# Patient Record
Sex: Female | Born: 1935 | Race: White | Hispanic: No | Marital: Single | Smoking: Never smoker
Health system: Southern US, Community
[De-identification: ages and names within clinical notes are randomized; demographics above are authoritative.]

## PROBLEM LIST (undated history)

## (undated) DIAGNOSIS — S0232XA Fracture of orbital floor, left side, initial encounter for closed fracture: Secondary | ICD-10-CM

## (undated) DIAGNOSIS — I429 Cardiomyopathy, unspecified: Secondary | ICD-10-CM

## (undated) DIAGNOSIS — S060X9A Concussion with loss of consciousness of unspecified duration, initial encounter: Secondary | ICD-10-CM

## (undated) DIAGNOSIS — R296 Repeated falls: Secondary | ICD-10-CM

## (undated) DIAGNOSIS — I480 Paroxysmal atrial fibrillation: Secondary | ICD-10-CM

## (undated) DIAGNOSIS — E039 Hypothyroidism, unspecified: Secondary | ICD-10-CM

## (undated) DIAGNOSIS — S060XAA Concussion with loss of consciousness status unknown, initial encounter: Secondary | ICD-10-CM

## (undated) DIAGNOSIS — I447 Left bundle-branch block, unspecified: Secondary | ICD-10-CM

## (undated) DIAGNOSIS — I10 Essential (primary) hypertension: Secondary | ICD-10-CM

## (undated) DIAGNOSIS — I272 Pulmonary hypertension, unspecified: Secondary | ICD-10-CM

## (undated) HISTORY — DX: Hypothyroidism, unspecified: E03.9

## (undated) HISTORY — DX: Pulmonary hypertension, unspecified: I27.20

## (undated) HISTORY — DX: Essential (primary) hypertension: I10

## (undated) HISTORY — DX: Cardiomyopathy, unspecified: I42.9

## (undated) HISTORY — DX: Left bundle-branch block, unspecified: I44.7

---

## 2006-04-09 ENCOUNTER — Ambulatory Visit (HOSPITAL_COMMUNITY): Admission: RE | Admit: 2006-04-09 | Discharge: 2006-04-09 | Payer: Self-pay | Admitting: Ophthalmology

## 2006-06-17 ENCOUNTER — Emergency Department (HOSPITAL_COMMUNITY): Admission: EM | Admit: 2006-06-17 | Discharge: 2006-06-17 | Payer: Self-pay | Admitting: Emergency Medicine

## 2006-06-17 ENCOUNTER — Ambulatory Visit (HOSPITAL_COMMUNITY): Admission: RE | Admit: 2006-06-17 | Discharge: 2006-06-17 | Payer: Self-pay | Admitting: Ophthalmology

## 2006-07-15 ENCOUNTER — Ambulatory Visit: Payer: Self-pay | Admitting: Cardiology

## 2006-07-21 ENCOUNTER — Ambulatory Visit: Payer: Self-pay | Admitting: Cardiology

## 2006-07-25 ENCOUNTER — Ambulatory Visit: Payer: Self-pay | Admitting: Internal Medicine

## 2006-07-25 ENCOUNTER — Inpatient Hospital Stay (HOSPITAL_COMMUNITY): Admission: AD | Admit: 2006-07-25 | Discharge: 2006-07-28 | Payer: Self-pay | Admitting: Internal Medicine

## 2006-08-06 ENCOUNTER — Ambulatory Visit: Payer: Self-pay | Admitting: Cardiology

## 2006-08-07 ENCOUNTER — Ambulatory Visit: Payer: Self-pay | Admitting: Cardiology

## 2006-08-20 ENCOUNTER — Ambulatory Visit: Payer: Self-pay | Admitting: Cardiology

## 2006-08-21 ENCOUNTER — Ambulatory Visit: Payer: Self-pay | Admitting: Cardiology

## 2006-10-08 ENCOUNTER — Ambulatory Visit (HOSPITAL_COMMUNITY): Admission: RE | Admit: 2006-10-08 | Discharge: 2006-10-08 | Payer: Self-pay | Admitting: Ophthalmology

## 2006-10-21 ENCOUNTER — Ambulatory Visit: Payer: Self-pay | Admitting: Cardiology

## 2007-10-14 ENCOUNTER — Ambulatory Visit: Payer: Self-pay | Admitting: Cardiology

## 2008-11-02 DIAGNOSIS — I10 Essential (primary) hypertension: Secondary | ICD-10-CM

## 2008-11-02 DIAGNOSIS — I4891 Unspecified atrial fibrillation: Secondary | ICD-10-CM | POA: Insufficient documentation

## 2008-11-02 DIAGNOSIS — I472 Ventricular tachycardia: Secondary | ICD-10-CM

## 2008-11-02 DIAGNOSIS — E785 Hyperlipidemia, unspecified: Secondary | ICD-10-CM

## 2010-03-04 ENCOUNTER — Encounter: Payer: Self-pay | Admitting: Cardiology

## 2010-03-04 DIAGNOSIS — I509 Heart failure, unspecified: Secondary | ICD-10-CM

## 2010-03-04 DIAGNOSIS — I059 Rheumatic mitral valve disease, unspecified: Secondary | ICD-10-CM

## 2010-03-05 DIAGNOSIS — I5023 Acute on chronic systolic (congestive) heart failure: Secondary | ICD-10-CM

## 2010-03-07 DIAGNOSIS — R072 Precordial pain: Secondary | ICD-10-CM

## 2010-03-07 DIAGNOSIS — R9430 Abnormal result of cardiovascular function study, unspecified: Secondary | ICD-10-CM

## 2010-03-13 NOTE — Consult Note (Signed)
Summary: Consultation Report/ PROGRESS NOTE  Consultation Report/ PROGRESS NOTE   Imported By: Dorise Hiss 03/07/2010 17:00:25  _____________________________________________________________________  External Attachment:    Type:   Image     Comment:   External Document

## 2010-06-04 NOTE — Assessment & Plan Note (Signed)
[facility]                          [facility] NOTE   NAME:Anna House, [name]                       MRN:          [id number]  DATE:10/14/2007                            DOB:          November 13, 1933    PRIMARY CARE PHYSICIAN:  Colon Branch, MD   HISTORY OF PRESENT ILLNESS:  The patient is a 77 year old female with  mental retardation and history of paroxysmal tachycardia.  The patient  has previously had PAF with RVR, but also fascicular ventricular  tachycardia that was of reentrant type, originating from the right  ventricular outflow tract.  She was initially placed on verapamil,  although I do not see this on her med list anymore.  Interestingly,  however, she has currently no complaints and reports no palpitations and  states that she is feeling quite well.  She has had no presyncope or  syncope.   MEDICATIONS:  Levothyroxine, amlodipine, hydralazine, quinapril,  labetalol, Sinclair, hydrochlorothiazide, omeprazole, Klor-Con, Zetia,  Flonase, clonazepam, aspirin, and iron.   PHYSICAL EXAMINATION:  VITAL SIGNS:  Blood pressure 132/71, heart rate  63, and weight 128 pounds.  NECK:  Normal carotid upstrokes. No carotid bruits.  LUNGS:  Clear breath sounds bilaterally.  HEART:  Regular rate and rhythm.  Normal S1 and S2.  No murmurs, rubs,  or gallops.  ABDOMEN:  Soft and nontender.  No rebound or guarding.  No midline  pulsatile mass.  EXTREMITIES:  No cyanosis, clubbing, or edema.  NEUROLOGIC:  The patient is alert and oriented and grossly nonfocal.   PROBLEMS:  1. Paroxysmal atrial fibrillation with rapid ventricular rate, stable,      normal sinus rhythm by EKG today.  2. 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  3. Mental retardation.  4. Hypertension.  5. Normal left ventricular function.  6. Hypothyroidism.  7. Dyslipidemia.   PLAN:  1. The patient is doing  remarkably well.  She is supposed to be on      verapamil, but is currently not.  2. I did not make any changes as she seemed hemodynamically stable      without any current      arrhythmias and we will continue her on the beta-blocker that she      is on right now.  3. The patient can follow up with [place] in 1 year.     Learta Codding, MD,FACC  Electronically Signed    GED/MedQ  DD: 10/14/2007  DT: 10/15/2007  Job #: [id number]

## 2010-06-04 NOTE — Assessment & Plan Note (Signed)
[facility]                                 ON-CALL NOTE   NAME:Rathmann, AUBREYANNA DORROUGH                       MRN:          [id number]  DATE:07/25/2006                            DOB:          02/23/1933    DATES OF TELEPHONE CONVERSATIONS:  July 24, 2006 through July 25, 2006.   I initially received a telephone calls from the [employer] monitoring  service considering Ms. Wease, who is wearing a monitor.  Shanta at  [facility] called on July 4th, stating Ms. Taormina was having runs of V  tach, initially had a 12-beat run and then a 19-beat run.  I attempted  to reach Ms. Wilmott at [phone], with no answer.  I then tried another  phone number that was supplied by the [employer] services that was a  cellphone number and no answer and no recording.  I then called the home  number again and left a message on the answering machine.  However, I  did not receive a return call through the answering service.  I then  received 2 more pages through the answering service regarding the  nonsustained V tach.   I then received a page on July 25, 2006, through the answering service  from [employer], returned a call there and spoke with Angelyn Punt, stating Ms.  Brugh had a 34-beat run of V tach.  I finally was able to reach Ms.  Kamiya at the home number.  I spoke with her sister, who stated Ms. Dieter  had a hearing impairment and she would relay the information.  I  instructed her that it was critical that Ms. Tenny be evaluated.  Her  sister stated that Ms. Joo lived in [place], and that she would  take her to [facility].  I related her that I would like EMS to  transport the patient secondary to increased length of periods of  nonsustained V tach.  She stated she would call 911 to have the patient  transported to [facility] which would be the closest facility.  She stated that patient had not complained of any chest discomfort or  palpitations.  No presyncope or  syncopal episodes leading up to these  ventricular arrhythmias.      Dorian Pod, ACNP  Electronically Signed      Pricilla Riffle, MD, [facility]  Electronically Signed   MB/MedQ  DD: 07/25/2006  DT: 07/25/2006  Job #: [id number]

## 2010-06-04 NOTE — Consult Note (Signed)
NAMEDEVANY, AJA                ACCOUNT NO.:  [id number]   MEDICAL RECORD NO.:  [id number]          PATIENT TYPE:  INP   LOCATION:  2011                         FACILITY:  [facility]   PHYSICIAN:  Duke Salvia, MD, FACCDATE OF BIRTH:  Apr 18, 1933   DATE OF CONSULTATION:  07/26/2006  DATE OF DISCHARGE:                                 CONSULTATION   CARDIOLOGY CONSULTATION:  Thank you very much for asking [place] to see  Anna House in consultation because of recurrent tachy-arrhythmias.   Anna House is a 77 year old mentally retarded and quite hard-of-hearing  woman who fell on her stairs in March.  It was felt that she lost her  balance.  She had had no prior history of syncope and no prior history  of palpitations.  In the 6 weeks or so prior to this hospitalization,  however, she has developed tachy-palpitations with associated shortness  of breath and dizziness.  These were apparently abrupt in onset and  offset.  She was given a [employer] monitor which is described below.  She describes no limitations in her exercise tolerance, no shortness of  breath, chest pain or peripheral edema.   Her thromboembolic risk factors are notable for hypertension.   PAST MEDICAL HISTORY:  In addition to the above notable for:  1. Chronic obstructive pulmonary disease.  2. Treated hypothyroidism.  3. G-E reflux disease.  4. Iron deficiency anemia.  5. Cleft palate status post repair.   PAST SURGICAL HISTORY:  1. Laparoscopic cholecystectomy.  2. Esophageal dilatation.   MEDICATIONS:  Include:  1. Armour thyroid 30 grains.  2. Quinapril 40.  3. Antivert.  4. Omeprazole 10 b.i.d.  5. Singulair.  6. Hydrochlorothiazide 25 mg.  7. Zocor 40 mg.  8. Zetia 10 mg.  9. Fluticasone.  10.Toprol 50 mg.  11.Klonopin.   ALLERGIES:  PENICILLIN.   SOCIAL HISTORY:  She is not married.  She lives with her sisters.   FAMILY HISTORY:  Noncontributory.   REVIEW OF SYSTEMS:  Across multiple organ  systems, noncontributory.   PHYSICAL EXAMINATION:  GENERAL:  She is an elderly Caucasian female  appearing her stated age of 76.  VITAL SIGNS: Blood pressure 159/69 and  pulse 79.  HEENT:  Demonstrated to xanthomas.  Neck veins were flat.  Carotids were  brisk and full bilaterally without bruits.  BACK:  Without kyphosis, scoliosis.  LUNGS:  Clear.  HEART: Sounds regular without murmurs, gallops or rubs.  ABDOMEN: Soft, with active bowel sounds without midline pulsations or  hepatomegaly.  EXTREMITIES:  Femoral pulses not examined.  Distal pulses were intact.  There was no cyanosis, clubbing or edema.  NEUROLOGICAL: Exam notable for a very nasal speech, bilateral hearing  aids and being quite hard of hearing.  SKIN:  Warm and dry.   Electrocardiogram has not been obtained.  Multiple rhythm strips have  been reviewed.  The findings include:   1. Sinus tachycardia.  2. A narrow complex tachycardia that is regular.  3. Atrial fibrillation with a rapid ventricular response.  4. PACs that are associated with block and pauses.  5.  There is also PVCs that appear to be emerging from the right      ventricular outflow tract, and there is a separate wide complex      tachycardia that appears to be ventricular in origin.  The QRS is      relatively narrow at approximately 150 milliseconds or so.  It      starts with a PVC of identical morphology and terminates with      shortening of the R-R interval. This is most consistent with a      triggered or automatic tachy-arrhythmia.  Based on the      relationships of the P waves, I suspect that this is ventricular      tachycardia.  6. Hypokalemia.  7. Mental retardation.  8. Thromboembolic risk factors notable for hypertension.  9. History of falls.  10.Tendency toward bradycardia.   DISCUSSION:  Anna House has a multitude of arrhythmias that have  occurred relatively suddenly in the last 3 to 6 weeks.  This is either  because they are  occurring now for the first time or they are becoming  manifest now for the first time.  It is not clear.  Mechanistically a  myocarditis or inflammatory process might explain the acute onset of  these rhythms, that they are in fact occurring for the first time.  It  may be that there have been a variety of other things that have been in  the background, and then 1 of these new tachycardias is what has  provoked symptoms and brought her to attention.   The fall and the near-syncopal episode are both concerning. They could  either be related to the tachy or the bradycardia as noted.   I am concerned that the bradycardia will likely result in her needing  pacemaker implantation.   Approach to the tachycardias can be variable.  Given the multitude of  arrhythmias my inclination is to proceed with medical therapy initially.  The wide-complex tachycardia if ventricular in origin is likely a  fascicular tachycardia based on its narrowness, the triggered nature,  and the structurally normal heart.  Verapamil may be particularly useful  for this tachycardia.  It may also have an effect on the  supraventricular tachycardia in general and atrial fibrillation rate  control more generically.   To that end what I would recommend we do is:  1. Discontinue Toprol.  2. Try verapamil.  3. May need back-up brady pacing.  4. Replete potassium.  5. Check magnesium.   Thank you for the consultation.      Duke Salvia, MD, [facility]  Electronically Signed     SCK/MEDQ  D:  07/26/2006  T:  07/26/2006  Job:  [id number]   cc:   Learta Codding, MD,FACC

## 2010-06-04 NOTE — H&P (Signed)
NAMEMERRIDY, PASCOE                ACCOUNT NO.:  [id number]   MEDICAL RECORD NO.:  [id number]          PATIENT TYPE:  INP   LOCATION:  2011                         FACILITY:  [facility]   PHYSICIAN:  Pricilla Riffle, MD, FACCDATE OF BIRTH:  1933-08-16   DATE OF ADMISSION:  07/25/2006  DATE OF DISCHARGE:                              HISTORY & PHYSICAL   IDENTIFICATION:  Anna House is a 77 year old woman who was admitted for  evaluation of tachypalpitation.   HISTORY OF PRESENT ILLNESS:  The patient is followed by Lewayne Bunting in  [place].  Note she had had recent tachypalpitations with dizziness.  She  was placed on an event monitor.  On July 21, 2006, she had two episodes  of nonsustained ventricular tachycardia and told to come to the hospital  for further evaluation.  She was treated with Cardizem and a cardiology  consult was obtained.  She was admitted to the medicine service and  placed on telemetry.  Enzymes were negative.  Had an echocardiogram done  that showed an LVF of 60-65%.  She was placed on Toprol XL and Cardizem  was discontinued.  She was scheduled for an outpatient stress test on  July 28, 2006.   The patient has been home with the event monitor, and again, she set it  off today.  The company, [employer], called her for, again, tachycardia,  and told her to go to the hospital, where she was seen.  She had 43  beats of a wider complex tachycardia that was about 230 beats per  minute.  She also had bursts that were different of atrial fibrillation.  The patient had no syncope.  Denied chest pain.  Had some dizziness.  She was treated there in [facility] with IV Lopressor and  converted to sinus rhythm with first degree A-V block.   ALLERGIES:  PENICILLIN.   MEDICATIONS:  1. Armour Thyroid 30 grains.  2. Quinapril 40.  3. Antivert p.r.n.  4. Omeprazole 10 b.i.d.  5. Singulair 10.  6. Hydrochlorothiazide 25.  7. Zocor 40.  8. Zetia 10.  9. Fluticasone nasal spray.  10.Toprol XL 50.  11.Klonopin 0.5 daily.   PAST MEDICAL HISTORY:  1. Tachyarrhythmias.  2. Mental retardation.  3. Hypothyroidism.  4. Hypertension.  5. History of cleft palate.  6. History of a fall a couple of months ago (end of March, actually).      Patient fell going up the stairs and had significant hematoma at      her eye, around her neck, bruised her leg.  No syncope at the time.  7. Gastroesophageal reflux.  8. Dyslipidemia.  9. Irritable bowel syndrome.  10.Chronic obstructive pulmonary disease/asthma.  11.Osteoarthritis.  12.Status post cholecystectomy.   SOCIAL HISTORY:  The patient is cared for by one of her sisters.  She  has three other sisters.  No history of tobacco or alcohol use.   FAMILY HISTORY:  Mother had a history of stroke.  Father had a history  of heart disease.   REVIEW OF SYSTEMS:  All systems are reviewed.  Patient uses  a walker at  her own decision at home.  No recent fall.  Otherwise, all systems  reviewed negative to the above problem except as noted above.   PHYSICAL EXAMINATION:  GENERAL:  On exam, the patient is currently in no  acute distress.  VITAL SIGNS:  Blood pressure is 178/76, pulse is 57 and regular,  respiratory rate is 16.  O2 sat is 100% on 2 liters.  HEENT:  Normocephalic, atraumatic.  EOMI.  PERRL.  Patient wearing  hearing aids.  NECK:  JVP is normal.  No bruits.  No thyromegaly.  LUNGS:  Clear to auscultation without wheezes or rales.  CARDIAC EXAM:  Regular rate and rhythm, S1, S2.  No S3 or S4.  Grade 1-  2/6 systolic murmur just at the outflow tract.  ABDOMEN:  Supple, nontender.  No hepatosplenomegaly.  Normal bowel  sounds.  EXTREMITIES:  No lower extremity edema.  2+ pulses.  Patient moving all  extremities.  NEURO EXAM:  Grossly, the patient is alert and oriented x3.  Cranial  nerves II-XII appear grossly intact.  Moving all extremities, but did  not test motor exam.   LABS:  Labs include a hemoglobin of 14,  WBC of 6.6, BNP of 146.  TSH of  3, troponin 0.02, CK of 45 with an MB of 1.2.  Potassium was 3.2.  EKG  at 8:15 a.m. showed atrial fibrillation with ventricular rate of 118  beats per minute.   IMPRESSION:  1. The patient is a 77 year old woman with no known history of      coronary artery disease, now with palpitations.  Indeed, she has      documented atrial fibrillation on monitor.  She also has different      morphologic tachyarrhythmia at about 230 beats per minute, question      if it is an SVT with aberrancy versus VT.  She does have a normal      left ventricle.  She has not suffered any hemodynamic collapse with      this, again arguing against a ventricular origin.  I have discussed      with Dr. Graciela Husbands of EP.  Would plan to continue monitoring.  Continue      beta blockade.  I will cut back on quinapril, as we may need higher      dose of beta-blocker.  He will assess further testing when he sees      the patient.  2. Hypertension.  Again, follow.  Adjust as needed.  3. Dyslipidemia, on Zetia and Zocor.  4. Hypothyroidism, recent TSH normal.  5. History of fall, again, with atrial fibrillation.  I am very      reluctant to place her on Coumadin or heparin.  Her risk for      bleeding, in my opinion, seems to outweigh benefit.  I have      discussed this with the family.  Since she had such a bad fall this      spring, I consider her a higher fall risk.  Will continue to      follow.     Pricilla Riffle, MD, [facility]  Electronically Signed    PVR/MEDQ  D:  07/25/2006  T:  07/25/2006  Job:  [id number]

## 2010-06-04 NOTE — Assessment & Plan Note (Signed)
[facility]                          [facility] NOTE   NAME:Narayanan, KEANA DUEITT                       MRN:          [id number]  DATE:10/21/2006                            DOB:          [birth year]    REFERRING PHYSICIAN:  Virgina Organ   HISTORY OF PRESENT ILLNESS:  The patient is a 77 year old female with  mental retardation and paroxysmal atrial fibrillation. The patient also  has presumed supraventricular tachycardia which is non re-entrant type  with origination from right ventricular outflow tract. The patient has  been doing well on verapamil therapy. She actually has had no recurrent  episodes of palpitations and/or syncope. She denies any chest pain,  shortness of breath, orthopnea or PND. She is essentially asymptomatic  from a cardiovascular perspective.   CURRENT MEDICATIONS:  Are not entirely clear and the patient's sister  will call us back today. We do know that she is on verapamil 300 mg a  day and that Zocor has been discontinued. Other medications will be  listed as soon as we receive a phone call from her.   PHYSICAL EXAMINATION:  VITAL SIGNS: Blood pressure 138/71, heart rate 78  beats per minute. Weight is 129.  NECK: Normal carotid upstroke. No carotid bruits.  LUNGS:  Clear breath sounds bilaterally.  HEART: Regular rate and rhythm. Normal S1, S2. No murmur, rubs or  gallops.  ABDOMEN: Soft and nontender. No rebound or guarding. Good bowel sounds.  EXTREMITIES: No cyanosis, clubbing or edema.  NEURO: The patient is alert and oriented and grossly nonfocal.   PROBLEM LIST:  1. Paroxysmal atrial fibrillation with RVR.  2. Fascicular ventricular tachycardia non re-entrant.  3. Documented PVCs, RVOT origin.  4. Mental retardation.  5. Hypertension.  6. Normal left ventricular function.  7. Hypothyroidism.  8. Dyslipidemia.   PLAN:  1. The patient is doing remarkably well. She has responded well to her      medical  therapy with verapamil.  2. The patient will be seen back in six months, but we have made no      changes in her drug regimen. The sister will call us back to list      the medications.     Learta Codding, MD,FACC  Electronically Signed    GED/MedQ  DD: 10/21/2006  DT: 10/21/2006  Job #: [id number]   cc:   Dr. Virgina Organ

## 2010-06-04 NOTE — Discharge Summary (Signed)
Anna House, Anna House                ACCOUNT NO.:  [id number]   MEDICAL RECORD NO.:  [id number]          PATIENT TYPE:  INP   LOCATION:  2011                         FACILITY:  [facility]   PHYSICIAN:  Anna Riffle, MD, FACCDATE OF BIRTH:  Jun 09, 1933   DATE OF ADMISSION:  07/25/2006  DATE OF DISCHARGE:  07/28/2006                               DISCHARGE SUMMARY   Examination, dictation, and preparation of dictation greater than 45  minutes.   FINAL DIAGNOSES:  1. Admitted with tachy palpitation.  2. CardioNet monitor shows multiple arrhythmias.      a.     Paroxysmal atrial fibrillation/rapid ventricular rate.      b.     Fascicular ventricular tachycardia (non - reentrant).      c.     Premature ventricular contractions (right ventricular       outflow tract in origin).      d.     Pauses with blocked premature atrial contractions.  3. An a.m. tremor.  The patient and her family advised to take this up      with Anna House.  This does not show up as a cardiac problem on a      monitor, and they are self-limiting and sometimes involve the whole      body in shaking for a limited amount of time.   SECONDARY DIAGNOSES:  1. History of fall March 2008.  2. Hypertension.  3. Mental retardation.  4. Echocardiogram showing ejection fraction 60-65%.  5. Treated hypothyroidism.  6. Cleft palate.  7. Gastroesophageal reflux disease.  8. Dyslipidemia.  9. Irritable bowel syndrome pacer.   PLAN:  1. Patient discharging with continued CardioNet monitor.  2. Stress test which was planned for July 28, 2006 will be rescheduled      at the [facility] through [facility] office.  3. Follow up with Dr. Lewayne House August 21, 2006 at 3 o'clock.  4. Follow up with Anna House concerning tremors.   BRIEF HISTORY:  Anna House is a 77 year old female followed by Dr. Andee House  in the [facility] office [facility].   The patient has had recent tachy palpitation and dizziness.  She was  placed on a CardioNet monitor July 21, 2006.  The study showed two  episodes of nonsustained ventricular tachycardia.  She was told to come  to the hospital for further evaluation.  She was treated with Cardizem,  and a cardiology consult obtained.  She was admitted to the medicine  service and placed on telemetry.  Enzymes were negative.  Echocardiogram  showed left ventricular function at 60-65%.  She was placed on Toprol  XL, and Cardizem itself was discontinued.  Scheduled for outpatient  stress test July 28, 2006.   At home with the event monitor it again showed dysrhythmia.  Company  called her for a tachycardia.  Told her to go to the hospital.  The  study showed 43 beats of a wider complex tachycardia that was about 230  beats per minute.  She also had burst of atrial fibrillation.  The  patient is not having syncope or chest pain.  She had some dizziness.  She has been treated at [facility] with IV Lopressor.  She  converted to sinus rhythm with first degree AV block transferring July 5  to [facility].   HOSPITAL COURSE:  The patient transferring to [facility] from [facility] with tachy palpitations and several dysrhythmias on CardioNet  monitor.  She was started on Toprol here, and an electrophysiology  consult obtained.  Dr. Graciela House saw the patient, and with careful  examination of all the __________  electrocardiogram strips parsed out  of several dysrhythmias one was atrial fibrillation and another  fascicular ventricular tachycardia with narrow complex between 120 and  130 milliseconds.  From its axis orientation we considered it to be a  nonreentrant ventricular tachycardia backed up by preserved left  ventricular function.  We started the patient on verapamil 120 mg daily.  The patient has maintained sinus rhythm with this prescription, but is  hypertensive on July 7.  Dr. Graciela House increased the verapamil to 240 mg  daily with observation overnight.  The  patient had some nocturnal  bradycardia with heart rates in the mid 50s.  The verapamil was held at  240 mg at the time of discharge on July 8.  She had careful walk study  to examine for exercise incompetence.  Her heart rate did go into the  80s easily with ambulation in the halls here at [facility].  The patient is discharging July 8 on the following medications.   MEDICATIONS:  1. Verapamil 240 mg daily.  2. Quinapril 40 mg daily.  3. Zocor 20 mg daily at bedtime.  This is a new dose, and it has been      shown that verapamil and Zocor have interaction.  4. Zetia 10 mg daily.  5. Omeprazole 20 mg twice daily.  6. Armour Thyroid 30 mg daily.  7. Singulair 10 mg daily.  8. Klonopin 0.5 mg at bedtime, 1/4 tab at during the day as needed.  9. Fluticasone nasal spray 2 sprays daily.  10.Hydrochlorothiazide to continue for blood pressure.  11.Potassium chloride 20 mEq daily along with her hydrochlorothiazide.      The patient had a potassium of 3 on admission here.  12.The patient is to stop taking Diltiazem, Toprol, metoprolol.   The patient is continuing to wear the heart monitor to check for  bradycardia.   1. If the patient exhibits bradycardia on the CardioNet monitor, a      pacemaker will be warranted.  2. Stress test in the [facility].  The date and      time will be arranged through the [facility].  3. The patient will see Dr. Andee House Friday, August 1, at 3 o'clock.      The patient is also to continue taking her eye drops.   Basic metabolic panel July 6:  Sodium 143, potassium 3, chloride 97,  carbon dioxide 32, glucose 65, BUN 6, creatinine 0.73.  BMET on July 7:  Sodium 141, potassium 4.5, chloride 104, carbonate 31, glucose 86, BUN  8,  creatinine 0.68.  Magnesium this admission 1.9.   Dictating once again greater than 45 minutes.      Anna Mirza, PA      Anna Riffle, MD, [facility]  Electronically Signed    GM/MEDQ  D:   07/28/2006  T:  07/28/2006  Job:  [id number]   cc:  Learta Codding, MD,FACC  Liliane House, M.D.

## 2010-06-04 NOTE — Assessment & Plan Note (Signed)
[facility]                          [facility]ICE NOTE   NAME:Anna House, Anna House                       MRN:          [id number]  DATE:07/15/2006                            DOB:          1933/12/23    REASON FOR CONSULTATION:  Evaluation of palpitations and dizziness and  lightheadedness.   HISTORY OF PRESENT ILLNESS:  The patient is a 77 year old single female  with apparent mental retardation, cleft palate, and history of  hypertension.  The patient reports a prior history several months ago of  a fall.  According to her sister, the patient tripped on a flight of  stairs and had a fall.  She was seen in the emergency room.  There is no  reported history of loss of consciousness or syncope.  She has had  difficulty managing her blood pressure with fluctuating blood pressure  readings.  Apparently, she also wore a 24-hour blood pressure monitor.  Some blood pressure medication had to be stopped due to hypotension.  The last 4-6 weeks, however, the patient has now had palpitations which  occur intermittently and typically lasts half an hour to an hour and  then abruptly terminates.  This has been associated with shortness of  breath but no chest pain.  The patient does report associated  lightheadedness and dizziness without frank syncope.  The patient states  that she has no definite dyspnea, but she is not particularly active.  She also had a near syncopal episode 3 weeks ago and had a CT scan done  which demonstrated small vessel disease and possibly evidence for old  thalamic infarct.  According to the patient's sister, these episodes are  occurring on a daily basis.  In the office today her EKG, however, shows  normal sinus rhythm with nonspecific T-wave changes.   ALLERGIES:  No known drug allergies.   MEDICATIONS:  1. Quinapril 40 mg a day.  2. Simvastatin 40 mg daily.  3. Singulair 10 mg a day.  4. Zetia 10 mg a day.  5. Thyroid  hormone replacement.  6. Omeprazole.  7. Klor-Con.  8. Hydrochlorothiazide 25 mg p.o. daily.  9. Fluticasone b.i.d.  10.Iron 65 mg p.o. daily.  11.__________  p.o. nightly.   SOCIAL HISTORY:  The patient lives in [place], she does not smoke.   FAMILY HISTORY:  Noncontributory.  Mother had a stroke and father died  from heart disease at a later age.   REVIEW OF SYSTEMS:  As per HPI.  Denies any vomiting, no fever, chills,  __________ dysuria frequency.  Positive for palpitations, positive for  presyncope.  No myalgias or arthralgias.   PHYSICAL EXAMINATION:  VITAL SIGNS:  Blood pressure is 160/81, heart  rate is 91 beats per minute, weight is 125 pounds.  IN GENERAL:  A female in no apparent distress.  NECK EXAM:  Normal carotid upstrokes, no carotid bruit.  HEENT:  Normal.  LUNGS:  Clear breath sounds bilaterally with no wheezing.  HEART:  Regular rate and rhythm, normal S1, S2.  No murmurs, rubs, or  gallops.  ABDOMEN:  Soft.  EXTREMITY  EXAM:  No cyanosis, clubbing, or edema.   A 12-lead EKG, normal sinus rhythm, nonspecific T-wave changes.   PROBLEM LIST:  1. Presyncope and dizziness.  2. Palpitations.  3. Hypertension, difficult to control.  4. Thyroid abnormality.  5. Cleft palate.  6. History of fall.   PLAN:  1. I suspect the patient may have episodes of paroxysmal atrial      fibrillation.  She is at risk for this given the possibility of      hypertensive heart disease.  2. I do not detect any pathological murmurs.  I do not think the      patient has aortic stenosis.  We will proceed with an      echocardiogram study to evaluate left ventricular function and rule      out structural heart disease as well as valvular heart disease.  3. A Cardiolite study will be done to rule out ischemic heart disease,      but more importantly, she will have a CardioNet monitor to document      if she truly has atrial fibrillation.  In the interim, I did start      the patient  on medical therapy given her hypertension, and I have      opted for Cardizem in the event that the patient has rapid tachy      palpitations.  I started her Cardizem 20 mg a day.  4. The patient will follow up with [place] in the next couple of weeks to      review her study results.  I have told the patient's sister to give      [place] a call, and we will see her earlier if she has any recurrent      problems.     Learta Codding, MD,FACC  Electronically Signed    GED/MedQ  DD: 07/15/2006  DT: 07/15/2006  Job #: [id number]   cc:   [place]

## 2010-06-04 NOTE — Assessment & Plan Note (Signed)
[facility]                          [facility]ICE NOTE   NAME:House, Anna BALAN                       MRN:          [id number]  DATE:08/21/2006                            DOB:          [birth year]    PRIMARY CARDIOLOGIST:  Dr. Lewayne Bunting.   REASON FOR VISIT:  Post-hospital follow up.   Anna House is a 77 year old female, recently seen by [place] here at [facility] in consultation, and who presented with no prior history of  heart disease. Anna House had just been recently evaluated in our clinic, by  Dr. Andee Lineman, for complaint of palpitations with associated dizziness and  was referred for further workup with a CardioNet monitor and stress  Cardiolite. Anna House was found to have transient episodes of narrow complex  tachycardia with rates as fast as 200 bpm. Given these findings, Anna  House was instructed to proceed directly to Anna emergency room and was  subsequently referred to [place] for re-evaluation.   A 2D echocardiogram was done revealing normal left ventricular function.  Medications were adjusted with substitution of Diltiazem with Toprol XL  50 daily. Anna House was then referred for an outpatient stress test.   However, Anna House was once again instructed by Anna CardioNet  monitoring service to proceed directly back to Anna hospital, given  findings of recurrent tachycardia. According to Anna discharge summary  from [facility], Anna House had one study demonstrating 43 beats of WCT at  approximately 230 bpm. Anna House was also noted to have bursts of atrial  fibrillation.   Following return to [facility], Anna House was thus transferred  to [facility] for further evaluation where Anna House was seen by our  electrophysiology team with Dr. Sherryl Manges. Essentially, he concluded  that Anna House had evidence of atrial fibrillation as well as fascicular  ventricular tachycardia with narrow complex (between 120 and 130  milliseconds). He also suggested that  this was a non re-entrant  ventricular tachycardia which was backed up by preserved left  ventricular function.   Anna House was thus started on Verapamil 120 which was then increased  to 240. This was temporarily held, secondary to some nocturnal  bradycardia, but then resumed at Anna same dose following stabilization  of blood pressure and heart rate. Anna House was discharged from [facility] three days later, on July 8.   Anna House was instructed to continue on CardioNet monitoring, which  has since been completed, to rule out any evidence of bradycardia. In  that event, Anna House could be considered for pacemaker implantation.  CardioNet monitoring has been reviewed, however, and suggests occasional  rates as low as 50, with otherwise rates in Anna 60 to 90 range, as well  as intermittent atrial fibrillation/flutter.   Clinically, Anna House apparently has been doing much better since her  most recent hospitalization at [facility]. Anna House herself has  mental retardation and is not able to provide much of a history.  However, Anna House presents with her sister who corroborates her  clinical status and states that Anna House is feeling much better.  Specifically, there has been no recent evidence of dizziness or syncope.   CURRENT MEDICATIONS:  1. Quinapril 40 daily.  2. Singulair 10 daily.  3. Zetia 10 daily.  4. Thyroid medication as directed.  5. Omeprazole 20 b.i.d.  6. Hydrochlorothiazide 25 daily.  7. Fluticasone b.i.d.  8. Iron 65 daily.  9. Klonopin 0.5 nightly.  10.Verapamil 300 daily.  11.Zocor 20 nightly.  12.KCL 20 daily.   PHYSICAL EXAMINATION:  VITAL SIGNS:  Blood pressure 120/63, pulse 71 and  regular, weight 125.  GENERAL:  This is a 77 year old female sitting upright and in no  distress.  HEENT:  Normocephalic atraumatic.  NECK:  Palpable bilateral carotid pulses without bruits; no JVD at 90  degrees.  LUNGS:  Clear to auscultation in all  fields.  HEART:  Regular rate and rhythm (S1, S2), no significant murmurs. No  rubs.  ABDOMEN:  Benign.  EXTREMITIES:  No edema.  NEUROLOGIC:  No focal deficit.   IMPRESSION:  1. Recurrent tachy-arhythmia.      a.     Documented paroxysmal atrial fibrillation with RVR,      b.     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      c.     Documented PVCs (RVOT origin).  2. Mental retardation.  3. Hypertension.  4. Normal left ventricular function.  5. Hypothyroidism.  6. Dyslipidemia.   PLAN:  1. Given that Anna House is clinically stable, recommendation is to      continue Anna current medication regimen with respect to rate      control. Given that Anna House is not a suitable candidate for Coumadin      anticoagulation, as also noted by Dr. Sherryl Manges during his      recent evaluation, as well as Anna fact that Anna House has a CHAD2      score of 1 (hypertension), I have suggested that Anna House start aspirin      81 mg daily. Anna sister was agreeable with this plan. I have also      suggested that we stop Zocor given Anna addition of Verapamil and      possible adverse interaction. Anna House can thus just continue on      Zetia.  2. I recommended a follow up metabolic profile for assessment of      electrolytes and renal function.  3. Schedule return clinic follow up with myself and Dr. Andee Lineman in two      months.      Gene Serpe, PA-C  Electronically Signed      Learta Codding, MD,FACC  Electronically Signed   GS/MedQ  DD: 08/21/2006  DT: 08/22/2006  Job #: [id number]   cc:   Dorise Hiss, M.D.

## 2010-11-01 LAB — BASIC METABOLIC PANEL
Calcium: 9.4
GFR calc Af Amer: >60
GFR calc non Af Amer: >60
Glucose, Bld: 84
Sodium: 138

## 2010-11-01 LAB — HEMOGLOBIN AND HEMATOCRIT, BLOOD: Hemoglobin: 12.4

## 2010-11-05 LAB — BASIC METABOLIC PANEL
BUN: 8
CO2: 32
Calcium: 9.4
Calcium: 9.5
Creatinine, Ser: 0.73
GFR calc Af Amer: >60
GFR calc non Af Amer: >60
Glucose, Bld: 86
Sodium: 141

## 2010-11-07 ENCOUNTER — Encounter (INDEPENDENT_AMBULATORY_CARE_PROVIDER_SITE_OTHER): Payer: Self-pay | Admitting: *Deleted

## 2010-11-20 ENCOUNTER — Ambulatory Visit (INDEPENDENT_AMBULATORY_CARE_PROVIDER_SITE_OTHER): Payer: Medicare Other | Admitting: Internal Medicine

## 2010-12-02 ENCOUNTER — Encounter (INDEPENDENT_AMBULATORY_CARE_PROVIDER_SITE_OTHER): Payer: Self-pay | Admitting: *Deleted

## 2010-12-02 ENCOUNTER — Encounter (INDEPENDENT_AMBULATORY_CARE_PROVIDER_SITE_OTHER): Payer: Self-pay | Admitting: Internal Medicine

## 2010-12-02 ENCOUNTER — Ambulatory Visit (INDEPENDENT_AMBULATORY_CARE_PROVIDER_SITE_OTHER): Payer: Medicare Other | Admitting: Internal Medicine

## 2010-12-02 ENCOUNTER — Telehealth (INDEPENDENT_AMBULATORY_CARE_PROVIDER_SITE_OTHER): Payer: Self-pay | Admitting: *Deleted

## 2010-12-02 ENCOUNTER — Other Ambulatory Visit (INDEPENDENT_AMBULATORY_CARE_PROVIDER_SITE_OTHER): Payer: Self-pay | Admitting: *Deleted

## 2010-12-02 DIAGNOSIS — K5900 Constipation, unspecified: Secondary | ICD-10-CM

## 2010-12-02 DIAGNOSIS — Z1211 Encounter for screening for malignant neoplasm of colon: Secondary | ICD-10-CM

## 2010-12-02 DIAGNOSIS — K5641 Fecal impaction: Secondary | ICD-10-CM

## 2010-12-02 MED ORDER — PEG-KCL-NACL-NASULF-NA ASC-C 100 G PO SOLR: 1.0000 | Freq: Once | ORAL | Status: DC

## 2010-12-02 NOTE — Patient Instructions (Addendum)
Soap suds enema today x 2. Possible fecal impaction.  Miralax 1 scoop daily. If on an antidiarrheal please stop. PR  Tomorrow.

## 2010-12-02 NOTE — Telephone Encounter (Signed)
Patient needs movi prep 

## 2010-12-02 NOTE — Progress Notes (Addendum)
Subjective:     Patient ID: Anna House, female   DOB: 07/25/1933, 77 y.o.   MRN: [id number]  ( Sister is providing the history). Patient cannot hear.   HPI  Anna House is a 77 yr old female referred to our office for diarrhea. Her sister tells me that she had a large loose bowel movement about 6 weeks ago and it was black.  No Pepto Bismol.  She usually has diarrhea about once a week or every two weeks. No further melena. No bright red rectal bleeding.  She usually has one BM a day.  Appetite is good. No weight loss. No abdominal pain per sister. No dysphagia.  She has never undergone a screening colonoscopy  Medications are from Dr. Elizbeth Squires notes.  Patient did not have her medications with her. Review of Systems see hpi Current Outpatient Prescriptions  Medication Sig Dispense Refill  . atorvastatin (LIPITOR) 10 MG tablet Take 10 mg by mouth daily.        Marland Kitchen esomeprazole (NEXIUM) 40 MG capsule Take 40 mg by mouth daily before breakfast.        . furosemide (LASIX) 40 MG tablet Take 40 mg by mouth 2 (two) times daily.        . hydrOXYzine (ATARAX/VISTARIL) 25 MG tablet Take 25 mg by mouth 3 (three) times daily as needed.        Marland Kitchen levothyroxine (SYNTHROID, LEVOTHROID) 75 MCG tablet Take 75 mcg by mouth daily.        . potassium chloride SA (K-DUR,KLOR-CON) 20 MEQ tablet Take 20 mEq by mouth 2 (two) times daily.        . quinapril (ACCUPRIL) 40 MG tablet Take 40 mg by mouth at bedtime.         History reviewed. No pertinent past surgical history. Past Medical History  Diagnosis Date  . Hypertension   . CHF (congestive heart failure)   . Hypothyroid   . Anemia   . Cleft palate    History reviewed. No pertinent past surgical history. No Known Allergies History reviewed. No pertinent past surgical history. No Known Allergies History   Social History  . Marital Status: Single    Spouse Name: N/A    Number of Children: N/A  . Years of Education: N/A   Occupational History  . Not on  file.   Social History Main Topics  . Smoking status: Never Smoker   . Smokeless tobacco: Not on file  . Alcohol Use: No  . Drug Use: No  . Sexually Active: Not on file   Other Topics Concern  . Not on file   Social History Narrative  . No narrative on file  History reviewed. No pertinent family history. Family Status  Relation Status Death Age  . Mother Deceased     CVA  . Father Deceased     CAD  . Sister Alive     Pme deceased frp, CAD. two in good health  . Brother Deceased     One brother alive and one deceased from an infection and arthritis        Objective:   Physical Exam Filed Vitals:   12/02/10 1054  BP: 142/60  Pulse: 64  Temp: 97.6 F (36.4 C)  Height: 5' 2.5" (1.588 m)  Weight: 132 lb 6.4 oz (60.056 kg)  \  Alert and oriented. Skin warm and dry. Oral mucosa is moist. Dentures. Sclera anicteric, conjunctivae is pink. Thyroid not enlarged. No cervical lymphadenopathy. Lungs clear. Heart  regular rate and rhythm.  Abdomen is soft. Bowel sounds are positive. No hepatomegaly. No abdominal masses felt. No tenderness.Stool is guaiac negative. Stool is very hard in rectum.   No edema to lower extremities. Patient is alert and oriented.      Assessment:    Hx of diarrhea and one episode of melena.  In need of screening colonoscopy Constipation, possible impaction.    Plan:    Screening colonoscopy.    Go to drug store and get 2 enemas.  Give one enemaand if no results give the next enema.  Call with PR tomorrow.   She may need to go to the  ED for a disimpaction.   Miralax 1 scoop daily. If on a diarrhea medications, she needs to stop it.

## 2010-12-31 ENCOUNTER — Encounter (HOSPITAL_COMMUNITY): Payer: Self-pay | Admitting: Pharmacy Technician

## 2011-01-08 MED ORDER — SODIUM CHLORIDE 0.45 % IV SOLN
Freq: Once | INTRAVENOUS | Status: AC
  Administered 2011-01-09: 14:00:00 via INTRAVENOUS

## 2011-01-09 ENCOUNTER — Encounter (HOSPITAL_COMMUNITY)
Admission: RE | Disposition: A | Discharge status: Home / Self Care | Payer: Self-pay | Source: Ambulatory Visit | Attending: Internal Medicine

## 2011-01-09 ENCOUNTER — Encounter (HOSPITAL_COMMUNITY): Payer: Self-pay

## 2011-01-09 ENCOUNTER — Ambulatory Visit (HOSPITAL_COMMUNITY)
Admission: RE | Admit: 2011-01-09 | Discharge: 2011-01-09 | Disposition: A | Discharge status: Home / Self Care | Payer: Medicare Other | Source: Ambulatory Visit | Attending: Internal Medicine | Admitting: Internal Medicine

## 2011-01-09 DIAGNOSIS — R198 Other specified symptoms and signs involving the digestive system and abdomen: Secondary | ICD-10-CM | POA: Insufficient documentation

## 2011-01-09 DIAGNOSIS — R197 Diarrhea, unspecified: Secondary | ICD-10-CM

## 2011-01-09 DIAGNOSIS — I10 Essential (primary) hypertension: Secondary | ICD-10-CM | POA: Insufficient documentation

## 2011-01-09 DIAGNOSIS — K644 Residual hemorrhoidal skin tags: Secondary | ICD-10-CM | POA: Insufficient documentation

## 2011-01-09 DIAGNOSIS — Z1211 Encounter for screening for malignant neoplasm of colon: Secondary | ICD-10-CM

## 2011-01-09 DIAGNOSIS — Z79899 Other long term (current) drug therapy: Secondary | ICD-10-CM | POA: Insufficient documentation

## 2011-01-09 HISTORY — PX: COLONOSCOPY: SHX5424

## 2011-01-09 SURGERY — COLONOSCOPY
Anesthesia: Moderate Sedation

## 2011-01-09 MED ORDER — MIDAZOLAM HCL 5 MG/5ML IJ SOLN
INTRAMUSCULAR | Status: AC
  Filled 2011-01-09: qty 10

## 2011-01-09 MED ORDER — STERILE WATER FOR IRRIGATION IR SOLN
Status: DC | PRN
  Administered 2011-01-09: 16:00:00

## 2011-01-09 MED ORDER — MEPERIDINE HCL 50 MG/ML IJ SOLN
INTRAMUSCULAR | Status: DC | PRN
  Administered 2011-01-09: 25 mg via INTRAVENOUS

## 2011-01-09 MED ORDER — MIDAZOLAM HCL 5 MG/5ML IJ SOLN
INTRAMUSCULAR | Status: DC | PRN
  Administered 2011-01-09 (×2): 1 mg via INTRAVENOUS
  Administered 2011-01-09: 2 mg via INTRAVENOUS

## 2011-01-09 MED ORDER — MEPERIDINE HCL 50 MG/ML IJ SOLN
INTRAMUSCULAR | Status: AC
  Filled 2011-01-09: qty 1

## 2011-01-09 NOTE — H&P (Signed)
Anna House is an 77 y.o. female.   Chief Complaint: She is here for colonoscopy. HPI: Patient is 77 year old Caucasian female was referred for office last month for persistent diarrhea. She was noted to be constipated. She is undergoing colonoscopy primarily for diagnostic purposes. She denies abdominal pain rectal bleeding. There is no history of recent antibiotic use. She says she has a good appetite and has not lost any weight. She's never had colonoscopy before  Past Medical History  Diagnosis Date  . Hypertension   . CHF (congestive heart failure)   . Hypothyroid   . Anemia   . Cleft palate     History reviewed. No pertinent past surgical history.  History reviewed. No pertinent family history. Social History:  reports that she has never smoked. She does not have any smokeless tobacco history on file. She reports that she does not drink alcohol or use illicit drugs.  Allergies: No Known Allergies  Medications Prior to Admission  Medication Dose Route Frequency Provider Last Rate Last Dose  . 0.45 % sodium chloride infusion   Intravenous Once Malissa Hippo, MD 20 mL/hr at 01/09/11 1338    . meperidine (DEMEROL) 50 MG/ML injection           . midazolam (VERSED) 5 MG/5ML injection            No current outpatient prescriptions on file as of 01/09/2011.    No results found for this or any previous visit (from the past 48 hour(s)). No results found.  Review of Systems  Constitutional: Negative for weight loss.  Gastrointestinal: Negative for nausea, vomiting, abdominal pain, blood in stool and melena.    Blood pressure 176/83, pulse 80, temperature 97.9 F (36.6 C), temperature source Oral, resp. rate 16, height 5' (1.524 m), SpO2 97.00%. Physical Exam  Constitutional: She appears well-developed and well-nourished.  HENT:  Mouth/Throat: Oropharynx is clear and moist.  Eyes: Conjunctivae are normal. No scleral icterus.  Neck: No thyromegaly present.  Cardiovascular:  Normal rate, regular rhythm and normal heart sounds.   No murmur heard. Respiratory: Effort normal and breath sounds normal.  GI: Soft. She exhibits no distension and no mass. There is no tenderness.  Musculoskeletal: She exhibits no edema.  Lymphadenopathy:    She has no cervical adenopathy.  Neurological: She is alert.  Skin: Skin is warm and dry.     Assessment/Plan History of diarrhea. Diagnostic colonoscopy  REHMAN,NAJEEB U 01/09/2011, 3:42 PM

## 2011-01-09 NOTE — Op Note (Signed)
COLONOSCOPY PROCEDURE REPORT  PATIENT:  Anna House  MR#:  [id number] Birthdate:  December 18, 1933, 77 y.o., female Endoscopist:  Dr. Malissa Hippo, MD Referred By:  Dr. Colon Branch, M.D. Procedure Date: 01/09/2011  Procedure:   Colonoscopy  Indications:  Patient is 77 year old Caucasian female who has noted change in her bowel habits. She initially developed diarrhea and by the time she was seen office she was constipated.  She is undergoing colonoscopy for diagnostic purposes. Informed consent; procedure and risks were reviewed with the patient and informed consent was obtained.  Medications:  Demerol 25 mg IV Versed 4 mg IV  Description of procedure:  After a digital rectal exam was performed, that colonoscope was advanced from the anus through the rectum and colon to the area of the cecum, ileocecal valve and appendiceal orifice. The cecum was deeply intubated. These structures were well-seen and photographed for the record. From the level of the cecum and ileocecal valve, the scope was slowly and cautiously withdrawn. The mucosal surfaces were carefully surveyed utilizing scope tip to flexion to facilitate fold flattening as needed. The scope was pulled down into the rectum where a thorough exam including retroflexion was performed.  Findings:   Prep excellent. Normal terminal ileum. Normal mucosa of the rectum and colon . Hemorrhoids noted below the dentate line.  Therapeutic/Diagnostic Maneuvers Performed:  None  Complications:  None  Cecal Withdrawal Time:  8 minutes  Impression:  Normal terminal ileum. Normal colonoscopy except external hemorrhoids.  Recommendations:  High fiber diet. Anna House will call office should she develop diarrhea again.  Anna House  01/09/2011 4:14 PM  CC: Dr. Colon Branch, MD & Dr. Bonnetta Barry ref. provider found

## 2011-01-23 ENCOUNTER — Encounter (HOSPITAL_COMMUNITY): Payer: Self-pay | Admitting: Internal Medicine

## 2011-08-29 ENCOUNTER — Encounter: Payer: Self-pay | Admitting: Cardiology

## 2011-08-29 DIAGNOSIS — I10 Essential (primary) hypertension: Secondary | ICD-10-CM

## 2011-08-31 ENCOUNTER — Other Ambulatory Visit: Payer: Self-pay

## 2011-08-31 DIAGNOSIS — I10 Essential (primary) hypertension: Secondary | ICD-10-CM

## 2011-08-31 DIAGNOSIS — I472 Ventricular tachycardia, unspecified: Secondary | ICD-10-CM

## 2011-09-01 DIAGNOSIS — I4891 Unspecified atrial fibrillation: Secondary | ICD-10-CM

## 2011-09-09 ENCOUNTER — Ambulatory Visit (INDEPENDENT_AMBULATORY_CARE_PROVIDER_SITE_OTHER): Payer: Medicare Other | Admitting: Cardiology

## 2011-09-09 ENCOUNTER — Encounter: Payer: Self-pay | Admitting: Cardiology

## 2011-09-09 VITALS — BP 166/77 | HR 69 | Ht 62.0 in | Wt 128.0 lb

## 2011-09-09 DIAGNOSIS — I519 Heart disease, unspecified: Secondary | ICD-10-CM

## 2011-09-09 DIAGNOSIS — I447 Left bundle-branch block, unspecified: Secondary | ICD-10-CM

## 2011-09-09 DIAGNOSIS — I509 Heart failure, unspecified: Secondary | ICD-10-CM

## 2011-09-09 DIAGNOSIS — I4891 Unspecified atrial fibrillation: Secondary | ICD-10-CM

## 2011-09-09 MED ORDER — AMLODIPINE BESYLATE 5 MG PO TABS: 5.0000 mg | ORAL_TABLET | Freq: Every day | ORAL | Status: AC

## 2011-09-09 NOTE — Patient Instructions (Signed)
   Increase Amlodipine to 5mg  daily (may take 2 of the 2.5mg  tabs till finish current supply) Your physician wants you to follow up in: 6 months.  You will receive a reminder letter in the mail one-two months in advance.  If you don't receive a letter, please call our office to schedule the follow up appointment

## 2011-09-28 DIAGNOSIS — I5042 Chronic combined systolic (congestive) and diastolic (congestive) heart failure: Secondary | ICD-10-CM | POA: Insufficient documentation

## 2011-09-28 DIAGNOSIS — I447 Left bundle-branch block, unspecified: Secondary | ICD-10-CM | POA: Insufficient documentation

## 2011-09-28 NOTE — Assessment & Plan Note (Signed)
Ejection fraction 2025% patient's mother candidate for ICD.

## 2011-09-28 NOTE — Assessment & Plan Note (Signed)
Chronic and stable.   

## 2011-09-28 NOTE — Assessment & Plan Note (Addendum)
The recent hospitalization patient was found to have a fibrillation with aberrancy.  No clear evidence of ventricular tachycardia not a candidate for Coumadin.on amiodarone.

## 2011-09-28 NOTE — Progress Notes (Signed)
Anna Bottoms, MD, [facility] Board Certified in Adult Cardiovascular Medicine,Internal Medicine and Critical Care Medicine    CC:    followup after recent hospitalization for atrial fibrillation                                                                              HPI:        Patient was recently hospitalized for a fibrillation with aberrancy and LV dysfunction.  He felt she was not a candidate for Coumadin or ICD.  She was started on amiodarone as well as spironolactone during this clinic visit and she was never started on spironolactone.  Hemostasis of confusional hospital discharge regarding her medications.  The patient was very this hospital admission but this has now resolved and she feels much better.  She has chronic left bundle branch block and an ejection fraction of 20-25%.  She is managed medically.  PMH: reviewed and listed in Problem List in Electronic Records (and see below) Past Medical History  Diagnosis Date  . Hypertension   . CHF (congestive heart failure)   . Hypothyroid   . Anemia   . Cleft palate    Past Surgical History  Procedure Date  . Colonoscopy 01/09/2011    Procedure: COLONOSCOPY;  Surgeon: Malissa Hippo, MD;  [facility]: [facility];  Service: Endoscopy;  Laterality: N/A;  2:00 pm    Allergies/SH/FHX : available in Electronic Records for review  No Known Allergies History   Social History  . Marital Status: Single    Spouse Name: N/A    Number of Children: N/A  . Years of Education: N/A   Occupational History  . Not on file.   Social History Main Topics  . Smoking status: Never Smoker   . Smokeless tobacco: Never Used  . Alcohol Use: No  . Drug Use: No  . Sexually Active: Not on file   Other Topics Concern  . Not on file   Social History Narrative  . No narrative on file   No family history on file.  Medications: Current Outpatient Prescriptions  Medication Sig Dispense Refill  . amiodarone (PACERONE) 200 MG tablet  Take 1 tablet by mouth Daily.      Marland Kitchen amLODipine (NORVASC) 5 MG tablet Take 1 tablet (5 mg total) by mouth daily.  30 tablet  6  . carvedilol (COREG) 3.125 MG tablet Take 1 tablet by mouth Twice daily.      Marland Kitchen esomeprazole (NEXIUM) 40 MG capsule Take 40 mg by mouth 2 (two) times daily.       . furosemide (LASIX) 40 MG tablet Take 40 mg by mouth 2 (two) times daily.        . hydrocortisone (ANUSOL-HC) 2.5 % rectal cream Place 1 application rectally 2 (two) times daily as needed.        . hydrOXYzine (ATARAX/VISTARIL) 25 MG tablet Take 25 mg by mouth at bedtime.       . hydrOXYzine (VISTARIL) 25 MG capsule Take 25 mg by mouth at bedtime.      Marland Kitchen levothyroxine (SYNTHROID, LEVOTHROID) 75 MCG tablet Take 75 mcg by mouth daily.        Marland Kitchen loteprednol (  LOTEMAX) 0.5 % ophthalmic suspension Place 1 drop into both eyes 2 (two) times daily.        . montelukast (SINGULAIR) 10 MG tablet Take 10 mg by mouth daily.        . pantoprazole (PROTONIX) 40 MG tablet Take 40 mg by mouth daily.      . potassium chloride SA (K-DUR,KLOR-CON) 20 MEQ tablet Take 20 mEq by mouth 2 (two) times daily.        . quinapril (ACCUPRIL) 20 MG tablet Take 1 tablet by mouth Daily.      . Vitamin D, Ergocalciferol, (DRISDOL) 50000 UNITS CAPS Take 50,000 Units by mouth every 7 (seven) days.        ROS: No nausea or vomiting. No fever or chills.No melena or hematochezia.No bleeding.No claudication  Physical Exam: BP 166/77  Pulse 69  Ht 5\' 2"  (1.575 m)  Wt 128 lb (58.06 kg)  BMI 23.41 kg/m2 General:well-nourished white female in no distress Neck:normal carotid upstroke without carotid bruits.  No thyromegaly or thyroid Lungs:clear breath sounds bilaterally Cardiac:regular rate and rhythm with normal S1 and S2 Vascular:no edema Skin:normal distal pulses skin warm and dry Physcologic:normal affect  12lead WUJ:WJXBJY sinus rhythm with left bundle branch block Limited bedside ECHO:N/A No images are attached to the  encounter.   I reviewed and summarized the old records. I reviewed ECG and prior blood work.  Assessment and Plan  ATRIAL FIBRILLATION The recent hospitalization patient was found to have a fibrillation with aberrancy.  No clear evidence of ventricular tachycardia not a candidate for Coumadin.on amiodarone.  Left bundle branch block Chronic and stable.  LV dysfunction Ejection fraction 2025% patient's mother candidate for ICD.    Patient Active Problem List  Diagnosis  . HYPERLIPIDEMIA-MIXED  . HYPERTENSION, UNSPECIFIED  . VENTRICULAR TACHYCARDIA  . ATRIAL FIBRILLATION  . Left bundle branch block  . LV dysfunction

## 2011-12-19 ENCOUNTER — Emergency Department (HOSPITAL_COMMUNITY): Payer: Medicare Other

## 2011-12-19 ENCOUNTER — Emergency Department (HOSPITAL_COMMUNITY)
Admission: EM | Admit: 2011-12-19 | Discharge: 2011-12-19 | Disposition: A | Discharge status: Home / Self Care | Payer: Medicare Other | Attending: Emergency Medicine | Admitting: Emergency Medicine

## 2011-12-19 ENCOUNTER — Encounter (HOSPITAL_COMMUNITY): Payer: Self-pay | Admitting: Emergency Medicine

## 2011-12-19 DIAGNOSIS — M542 Cervicalgia: Secondary | ICD-10-CM | POA: Insufficient documentation

## 2011-12-19 DIAGNOSIS — S0993XA Unspecified injury of face, initial encounter: Secondary | ICD-10-CM | POA: Insufficient documentation

## 2011-12-19 DIAGNOSIS — I509 Heart failure, unspecified: Secondary | ICD-10-CM | POA: Insufficient documentation

## 2011-12-19 DIAGNOSIS — Z862 Personal history of diseases of the blood and blood-forming organs and certain disorders involving the immune mechanism: Secondary | ICD-10-CM | POA: Insufficient documentation

## 2011-12-19 DIAGNOSIS — Y9289 Other specified places as the place of occurrence of the external cause: Secondary | ICD-10-CM | POA: Insufficient documentation

## 2011-12-19 DIAGNOSIS — Y9301 Activity, walking, marching and hiking: Secondary | ICD-10-CM | POA: Insufficient documentation

## 2011-12-19 DIAGNOSIS — M549 Dorsalgia, unspecified: Secondary | ICD-10-CM | POA: Insufficient documentation

## 2011-12-19 DIAGNOSIS — R296 Repeated falls: Secondary | ICD-10-CM | POA: Insufficient documentation

## 2011-12-19 DIAGNOSIS — I10 Essential (primary) hypertension: Secondary | ICD-10-CM | POA: Insufficient documentation

## 2011-12-19 DIAGNOSIS — M12859 Other specific arthropathies, not elsewhere classified, unspecified hip: Secondary | ICD-10-CM | POA: Insufficient documentation

## 2011-12-19 DIAGNOSIS — W19XXXA Unspecified fall, initial encounter: Secondary | ICD-10-CM

## 2011-12-19 DIAGNOSIS — Z23 Encounter for immunization: Secondary | ICD-10-CM | POA: Insufficient documentation

## 2011-12-19 DIAGNOSIS — E079 Disorder of thyroid, unspecified: Secondary | ICD-10-CM | POA: Insufficient documentation

## 2011-12-19 MED ORDER — TETANUS-DIPHTH-ACELL PERTUSSIS 5-2.5-18.5 LF-MCG/0.5 IM SUSP
0.5000 mL | Freq: Once | INTRAMUSCULAR | Status: AC
  Administered 2011-12-19: 0.5 mL via INTRAMUSCULAR
  Filled 2011-12-19: qty 0.5

## 2011-12-19 NOTE — ED Notes (Signed)
Per EMS pt had an un witnessed fall in her front yard and was found face down by family. Pt c/o of neck and back pain and pain above the left knee, and left elbow. Pt is hard to understand so EMS was unable to obtain a history or more details about pain and exact location. Vitals 145/68, 59 P CBG 114. 22g RAC. Pt was in flutter on ekg strip.

## 2011-12-19 NOTE — ED Notes (Signed)
Patient transported to CT 

## 2011-12-19 NOTE — ED Notes (Addendum)
MD at bedside. 

## 2011-12-19 NOTE — ED Provider Notes (Addendum)
History     CSN: [id number]  Arrival date & time 12/19/11  1231   First MD Initiated Contact with Patient 12/19/11 1248      Chief Complaint  Patient presents with  . Fall    (Consider location/radiation/quality/duration/timing/severity/associated sxs/prior treatment) HPI Comments: Patient presents after sustaining a fall on her front car. She states that she was walking in her yard with her walker and she stubbed her toe and will downhill over her walker. She denies any syncope. She complains of pain to her neck and back and her left side. She denies any recent illnesses, dizziness, chest pain or shortness of breath that preceded the fall. She complains of constant throbbing pain to her neck back and her left side. She is not on any anticoagulants.  Patient is a 78 y.o. female presenting with fall.  Fall Pertinent negatives include no fever, no numbness, no abdominal pain, no nausea, no vomiting, no hematuria and no headaches.    Past Medical History  Diagnosis Date  . Hypertension   . CHF (congestive heart failure)   . Hypothyroid   . Anemia   . Cleft palate     Past Surgical History  Procedure Date  . Colonoscopy 01/09/2011    Procedure: COLONOSCOPY;  Surgeon: Malissa Hippo, MD;  Location: [facility];  Service: Endoscopy;  Laterality: N/A;  2:00 pm    History reviewed. No pertinent family history.  History  Substance Use Topics  . Smoking status: Never Smoker   . Smokeless tobacco: Never Used  . Alcohol Use: No    OB History    Grav Para Term Preterm Abortions TAB SAB Ect Mult Living                  Review of Systems  Constitutional: Negative for fever, chills, diaphoresis and fatigue.  HENT: Positive for neck pain. Negative for congestion, rhinorrhea and sneezing.   Eyes: Negative.   Respiratory: Negative for cough, chest tightness and shortness of breath.   Cardiovascular: Negative for chest pain and leg swelling.  Gastrointestinal: Negative for  nausea, vomiting, abdominal pain, diarrhea and blood in stool.  Genitourinary: Negative for frequency, hematuria, flank pain and difficulty urinating.  Musculoskeletal: Positive for back pain and arthralgias.  Skin: Negative for rash.  Neurological: Negative for dizziness, speech difficulty, weakness, numbness and headaches.    Allergies  Review of patient's allergies indicates no known allergies.  Home Medications   Current Outpatient Rx  Name  Route  Sig  Dispense  Refill  . AMIODARONE HCL 200 MG PO TABS   Oral   Take 1 tablet by mouth Daily.         Marland Kitchen AMLODIPINE BESYLATE 5 MG PO TABS   Oral   Take 1 tablet (5 mg total) by mouth daily.   30 tablet   6     Dose increased 09/09/2011.   Marland Kitchen CARVEDILOL 3.125 MG PO TABS   Oral   Take 1 tablet by mouth Twice daily.         Marland Kitchen ESOMEPRAZOLE MAGNESIUM 40 MG PO CPDR   Oral   Take 40 mg by mouth 2 (two) times daily.          . FUROSEMIDE 40 MG PO TABS   Oral   Take 40 mg by mouth 2 (two) times daily.           Marland Kitchen HYDROCORTISONE 2.5 % RE CREA   Rectal   Place 1 application rectally 2 (  two) times daily as needed.           Marland Kitchen HYDROXYZINE HCL 25 MG PO TABS   Oral   Take 25 mg by mouth at bedtime.          Marland Kitchen HYDROXYZINE PAMOATE 25 MG PO CAPS   Oral   Take 25 mg by mouth at bedtime.         Marland Kitchen LEVOTHYROXINE SODIUM 75 MCG PO TABS   Oral   Take 75 mcg by mouth daily.           Marland Kitchen LOTEPREDNOL ETABONATE 0.5 % OP SUSP   Both Eyes   Place 1 drop into both eyes 2 (two) times daily.           Marland Kitchen MONTELUKAST SODIUM 10 MG PO TABS   Oral   Take 10 mg by mouth daily.           Marland Kitchen PANTOPRAZOLE SODIUM 40 MG PO TBEC   Oral   Take 40 mg by mouth daily.         Marland Kitchen POTASSIUM CHLORIDE CRYS ER 20 MEQ PO TBCR   Oral   Take 20 mEq by mouth 2 (two) times daily.           . QUINAPRIL HCL 20 MG PO TABS   Oral   Take 1 tablet by mouth Daily.         Marland Kitchen VITAMIN D (ERGOCALCIFEROL) 50000 UNITS PO CAPS   Oral   Take  50,000 Units by mouth every 7 (seven) days.           BP 175/102  Pulse 55  Temp 97.9 F (36.6 C) (Oral)  Resp 22  SpO2 100%  Physical Exam  Constitutional: She is oriented to person, place, and time. She appears well-developed and well-nourished.  HENT:  Head: Normocephalic and atraumatic.       Small abrasion to the right cheek.  Eyes: Pupils are equal, round, and reactive to light.  Neck: Normal range of motion. Neck supple.       Patient has mild tenderness along the mid and lower cervical spine. She also has mild tenderness throughout the thoracic and lumbosacral spine. No step-offs or deformities are noted.  Cardiovascular: Normal rate, regular rhythm and normal heart sounds.   Pulmonary/Chest: Effort normal and breath sounds normal. No respiratory distress. She has no wheezes. She has no rales. She exhibits no tenderness.  Abdominal: Soft. Bowel sounds are normal. There is no tenderness. There is no rebound and no guarding.  Musculoskeletal: Normal range of motion. She exhibits no edema.       Positive pain on range of motion of the left shoulder. She has mild tenderness to the left knee and left hip. No deformity or swelling is noted. She has no pain on range of motion of the right arm and right leg. Neurovascular intact.  Lymphadenopathy:    She has no cervical adenopathy.  Neurological: She is alert and oriented to person, place, and time.  Skin: Skin is warm and dry. No rash noted.  Psychiatric: She has a normal mood and affect.    ED Course  Procedures (including critical care time)   Date: 12/19/2011  Rate: 92  Rhythm: indeterminate, unclear if this is sinus with artifact or a-flutter, will repeat EKG  QRS Axis: left  Intervals: normal  ST/T Wave abnormalities: nonspecific ST/T changes  Conduction Disutrbances:left bundle branch block  Narrative Interpretation:   Old EKG Reviewed: changes noted,  1. Fall       MDM  Pt well appearing with  mechanical fall.  No preceeding symptoms.  Pt awaiting imaging studies.  Will move to CDU.  If x-rays negative and pt can ambulate, will plan on discharge.        Rolan Bucco, MD 12/19/11 1348  Rolan Bucco, MD 12/19/11 [phone]

## 2011-12-19 NOTE — ED Provider Notes (Signed)
Medical screening examination/treatment/procedure(s) were performed by non-physician practitioner and as supervising physician I was immediately available for consultation/collaboration.   Rolan Bucco, MD 12/19/11 [phone]

## 2011-12-19 NOTE — ED Provider Notes (Signed)
3:20 - x-rays resulted and reviewed. Negative for acute bony injury or bleed. Collar removed. Patient to be ambulated to make sure she is at baseline weight bearing ability and steady. Anticipate discharge.   Rodena Medin, PA-C 12/19/11 1525

## 2012-04-03 ENCOUNTER — Emergency Department (HOSPITAL_COMMUNITY): Payer: Medicare Other

## 2012-04-03 ENCOUNTER — Emergency Department (HOSPITAL_COMMUNITY)
Admission: EM | Admit: 2012-04-03 | Discharge: 2012-04-03 | Disposition: A | Discharge status: Home / Self Care | Payer: Medicare Other | Attending: Emergency Medicine | Admitting: Emergency Medicine

## 2012-04-03 ENCOUNTER — Encounter (HOSPITAL_COMMUNITY): Payer: Self-pay | Admitting: Emergency Medicine

## 2012-04-03 DIAGNOSIS — S0993XA Unspecified injury of face, initial encounter: Secondary | ICD-10-CM | POA: Insufficient documentation

## 2012-04-03 DIAGNOSIS — Y9301 Activity, walking, marching and hiking: Secondary | ICD-10-CM | POA: Insufficient documentation

## 2012-04-03 DIAGNOSIS — IMO0002 Reserved for concepts with insufficient information to code with codable children: Secondary | ICD-10-CM | POA: Insufficient documentation

## 2012-04-03 DIAGNOSIS — Y9289 Other specified places as the place of occurrence of the external cause: Secondary | ICD-10-CM | POA: Insufficient documentation

## 2012-04-03 DIAGNOSIS — S0990XA Unspecified injury of head, initial encounter: Secondary | ICD-10-CM | POA: Insufficient documentation

## 2012-04-03 DIAGNOSIS — E039 Hypothyroidism, unspecified: Secondary | ICD-10-CM | POA: Insufficient documentation

## 2012-04-03 DIAGNOSIS — R296 Repeated falls: Secondary | ICD-10-CM | POA: Insufficient documentation

## 2012-04-03 DIAGNOSIS — Z862 Personal history of diseases of the blood and blood-forming organs and certain disorders involving the immune mechanism: Secondary | ICD-10-CM | POA: Insufficient documentation

## 2012-04-03 DIAGNOSIS — I10 Essential (primary) hypertension: Secondary | ICD-10-CM | POA: Insufficient documentation

## 2012-04-03 DIAGNOSIS — Z79899 Other long term (current) drug therapy: Secondary | ICD-10-CM | POA: Insufficient documentation

## 2012-04-03 DIAGNOSIS — I509 Heart failure, unspecified: Secondary | ICD-10-CM | POA: Insufficient documentation

## 2012-04-03 DIAGNOSIS — Q359 Cleft palate, unspecified: Secondary | ICD-10-CM | POA: Insufficient documentation

## 2012-04-03 NOTE — ED Notes (Signed)
Moves all extremities as much as possible while secured on LSB.  Moaned with pain when right leg lifted/moved although was observed to move right leg by herself without moaning.  Abrasions to both knees.  Patient states she has wet her clothing.  Will obtain assistance to remove from LSB and remove clothing

## 2012-04-03 NOTE — ED Provider Notes (Signed)
History     CSN: [id number]  Arrival date & time 04/03/12  Anna House   First MD Initiated Contact with Patient 04/03/12 2023      Chief Complaint  Patient presents with  . Fall    fell while using walker, moves all extremeties, abrasions to both knees, immobilized on LSB with C-Collar in place.    (Consider location/radiation/quality/duration/timing/severity/associated sxs/prior treatment) HPI  The patient presents with multiple family members to provide the history of present illness.  The patient had a witnessed fall just prior to arrival.  She uses a walker.  She was observed to fall face forward over the walker as it caught the edge of the carpet.  The patient is awake and alert, but has verbal difficulty, and with c-collar in place cannot provide significant details of the history of present illness.  Per report the patient had no loss of consciousness, no subsequent vomiting, no incontinence.  The patient herself confirms that she has minimal pain.  The pain seems to be in the head and neck region.  The patient denies confusion, disorientation, unilateral weakness.  Past Medical History  Diagnosis Date  . Hypertension   . CHF (congestive heart failure)   . Hypothyroid   . Anemia   . Cleft palate     Past Surgical History  Procedure Laterality Date  . Colonoscopy  01/09/2011    Procedure: COLONOSCOPY;  Surgeon: Malissa Hippo, MD;  Location: [facility];  Service: Endoscopy;  Laterality: N/A;  2:00 pm    No family history on file.  History  Substance Use Topics  . Smoking status: Never Smoker   . Smokeless tobacco: Never Used  . Alcohol Use: No    OB History   Grav Para Term Preterm Abortions TAB SAB Ect Mult Living                  Review of Systems  All other systems reviewed and are negative.    Allergies  Review of patient's allergies indicates no known allergies.  Home Medications   Current Outpatient Rx  Name  Route  Sig  Dispense  Refill  .  amiodarone (PACERONE) 200 MG tablet   Oral   Take 1 tablet by mouth Daily.         Marland Kitchen amLODipine (NORVASC) 5 MG tablet   Oral   Take 1 tablet (5 mg total) by mouth daily.   30 tablet   6     Dose increased 09/09/2011.   . clonazePAM (KLONOPIN) 0.5 MG tablet   Oral   Take 0.5 mg by mouth 4 (four) times daily.         . furosemide (LASIX) 40 MG tablet   Oral   Take 40 mg by mouth 2 (two) times daily.           Marland Kitchen LORazepam (ATIVAN) 1 MG tablet   Oral   Take 1 mg by mouth 2 (two) times daily.         . carvedilol (COREG) 3.125 MG tablet   Oral   Take 1 tablet by mouth Twice daily.         . hydrOXYzine (ATARAX/VISTARIL) 25 MG tablet   Oral   Take 25 mg by mouth at bedtime.          Marland Kitchen levothyroxine (SYNTHROID, LEVOTHROID) 75 MCG tablet   Oral   Take 75 mcg by mouth daily.           Marland Kitchen loteprednol (  LOTEMAX) 0.5 % ophthalmic suspension   Both Eyes   Place 1 drop into both eyes 2 (two) times daily.           . montelukast (SINGULAIR) 10 MG tablet   Oral   Take 10 mg by mouth daily.           . pantoprazole (PROTONIX) 40 MG tablet   Oral   Take 40 mg by mouth daily.         . potassium chloride SA (K-DUR,KLOR-CON) 20 MEQ tablet   Oral   Take 20 mEq by mouth 2 (two) times daily.           . quinapril (ACCUPRIL) 20 MG tablet   Oral   Take 1 tablet by mouth Daily.           BP 180/54  Pulse 58  SpO2 95%  Physical Exam  Nursing note and vitals reviewed. Constitutional: She is oriented to person, place, and time. She appears well-developed and well-nourished. No distress. Cervical collar and backboard in place.  HENT:  Head: Normocephalic and atraumatic.  Patient's cleft palate, is edentulous  Eyes: Conjunctivae and EOM are normal.  Neck: No tracheal deviation present.  Cardiovascular: Normal rate and regular rhythm.   Pulmonary/Chest: Effort normal and breath sounds normal. No stridor. No respiratory distress.  Abdominal: She exhibits no  distension.  Musculoskeletal: She exhibits no edema.  Pelvis is stable, though with mild tenderness to palpation.  Both knees have superficial abrasions, but appear old.  She moves her hips, knees, ankles spontaneously, appropriately.  Both shoulders, elbows, wrists are within normal limits.  Neurological: She is alert and oriented to person, place, and time. No cranial nerve deficit.  Skin: Skin is warm and dry.  Psychiatric: She has a normal mood and affect.    ED Course  Procedures (including critical care time)  Labs Reviewed - No data to display Dg Pelvis 1-2 Views  04/03/2012  *RADIOLOGY REPORT*  Clinical Data: Right greater than left hip pain after fall today.  PELVIS - 1-2 VIEW  Comparison: Pelvis and left hip 12/19/2011  Findings: Degenerative changes in both hips, greater on the right. There is near complete loss of acetabular joint space on the right with sclerosis and subcortical lucencies present on both sides of the joint.  Less prominent joint space narrowing and sclerosis on the left with osteophytes on the femoral side.  The pelvic rim, SI joints, and symphysis pubis are not displaced.  No acute fracture or subluxation is appreciated.  IMPRESSION: Degenerative changes in the hips, greatest on the right.  No acute fractures identified.   Original Report Authenticated By: Burman Nieves, M.D.      No diagnosis found.  o2-99%ra, nml  MDM  This elderly female with chronic use of a walker presents after a mechanical fall that was witnessed.  However, the patient cannot describe entirety of the fall, and given concern for head trauma, neck pain, she had radiographic studies of these areas, as well as her pelvis.  With reassuring results, the patient was discharged in stable condition to follow up with her primary care physician, and orthopedics as needed.    Gerhard Munch, MD 04/03/12 2201

## 2012-04-03 NOTE — ED Notes (Signed)
Now moves all extremeties without complaint

## 2012-04-03 NOTE — ED Notes (Addendum)
Removed from LSB per protocol.  No noted tenderness to back / posterior surface.  C-collar remains in place. Warm blanket applied

## 2012-04-03 NOTE — ED Notes (Signed)
Hx obtained from EMS  Family reports patient going up steps, did not lift her walker high enough, fell over the walker.  Arrived on LSB with C-collar, full immobilization.

## 2012-05-13 ENCOUNTER — Emergency Department (HOSPITAL_COMMUNITY)
Admission: EM | Admit: 2012-05-13 | Discharge: 2012-05-13 | Disposition: A | Discharge status: Home / Self Care | Payer: Medicare Other | Attending: Emergency Medicine | Admitting: Emergency Medicine

## 2012-05-13 ENCOUNTER — Emergency Department (HOSPITAL_COMMUNITY): Payer: Medicare Other

## 2012-05-13 ENCOUNTER — Encounter (HOSPITAL_COMMUNITY): Payer: Self-pay | Admitting: Emergency Medicine

## 2012-05-13 DIAGNOSIS — E039 Hypothyroidism, unspecified: Secondary | ICD-10-CM | POA: Insufficient documentation

## 2012-05-13 DIAGNOSIS — Y9289 Other specified places as the place of occurrence of the external cause: Secondary | ICD-10-CM | POA: Insufficient documentation

## 2012-05-13 DIAGNOSIS — W19XXXA Unspecified fall, initial encounter: Secondary | ICD-10-CM

## 2012-05-13 DIAGNOSIS — W010XXA Fall on same level from slipping, tripping and stumbling without subsequent striking against object, initial encounter: Secondary | ICD-10-CM | POA: Insufficient documentation

## 2012-05-13 DIAGNOSIS — Y9389 Activity, other specified: Secondary | ICD-10-CM | POA: Insufficient documentation

## 2012-05-13 DIAGNOSIS — I509 Heart failure, unspecified: Secondary | ICD-10-CM | POA: Insufficient documentation

## 2012-05-13 DIAGNOSIS — I10 Essential (primary) hypertension: Secondary | ICD-10-CM | POA: Insufficient documentation

## 2012-05-13 DIAGNOSIS — Z79899 Other long term (current) drug therapy: Secondary | ICD-10-CM | POA: Insufficient documentation

## 2012-05-13 DIAGNOSIS — Z862 Personal history of diseases of the blood and blood-forming organs and certain disorders involving the immune mechanism: Secondary | ICD-10-CM | POA: Insufficient documentation

## 2012-05-13 DIAGNOSIS — Z87798 Personal history of other (corrected) congenital malformations: Secondary | ICD-10-CM | POA: Insufficient documentation

## 2012-05-13 DIAGNOSIS — S0993XA Unspecified injury of face, initial encounter: Secondary | ICD-10-CM | POA: Insufficient documentation

## 2012-05-13 NOTE — ED Provider Notes (Signed)
History    This chart was scribed for Anna Hutching, MD by Quintella Reichert, ED scribe.  This patient was seen in room APA11/APA11 and the patient's care was started at 10:07 AM.   CSN: [id number]  Arrival date & time 05/13/12  0272      Chief Complaint  Patient presents with  . Fall   Level 5 Caveat: Mild Dementia  The history is provided by the patient and a relative. No language interpreter was used.    Anna House is a 79 y.o. female with h/o frequent falls brought to the Emergency Department due to a fall this morning.  Pt states she tripped and fell in the bedroom.  She denies subsequent pain in any area.  Pt's sister reports her behavior is unchanged following the fall.  Past Medical History  Diagnosis Date  . Hypertension   . CHF (congestive heart failure)   . Hypothyroid   . Anemia   . Cleft palate     Past Surgical History  Procedure Laterality Date  . Colonoscopy  01/09/2011    Procedure: COLONOSCOPY;  Surgeon: Malissa Hippo, MD;  Location: [facility];  Service: Endoscopy;  Laterality: N/A;  2:00 pm    History reviewed. No pertinent family history.  History  Substance Use Topics  . Smoking status: Never Smoker   . Smokeless tobacco: Never Used  . Alcohol Use: No    OB History   Grav Para Term Preterm Abortions TAB SAB Ect Mult Living                  Review of Systems  Unable to perform ROS: Dementia   A complete 10 system review of systems was obtained and all systems are negative except as noted in the HPI and PMH.    Allergies  Review of patient's allergies indicates no known allergies.  Home Medications   Current Outpatient Rx  Name  Route  Sig  Dispense  Refill  . amiodarone (PACERONE) 200 MG tablet   Oral   Take 1 tablet by mouth Daily.         Marland Kitchen amLODipine (NORVASC) 5 MG tablet   Oral   Take 1 tablet (5 mg total) by mouth daily.   30 tablet   6     Dose increased 09/09/2011.   . carvedilol (COREG) 3.125 MG tablet    Oral   Take 1 tablet by mouth Twice daily.         . clonazePAM (KLONOPIN) 0.5 MG tablet   Oral   Take 0.5 mg by mouth 4 (four) times daily.         . furosemide (LASIX) 40 MG tablet   Oral   Take 40 mg by mouth 2 (two) times daily.           . hydrOXYzine (ATARAX/VISTARIL) 25 MG tablet   Oral   Take 25 mg by mouth at bedtime.          Marland Kitchen levothyroxine (SYNTHROID, LEVOTHROID) 75 MCG tablet   Oral   Take 75 mcg by mouth daily.           Marland Kitchen LORazepam (ATIVAN) 1 MG tablet   Oral   Take 1 mg by mouth 2 (two) times daily.         Marland Kitchen loteprednol (LOTEMAX) 0.5 % ophthalmic suspension   Both Eyes   Place 1 drop into both eyes 2 (two) times daily.           Marland Kitchen  montelukast (SINGULAIR) 10 MG tablet   Oral   Take 10 mg by mouth daily.           . pantoprazole (PROTONIX) 40 MG tablet   Oral   Take 40 mg by mouth daily.         . potassium chloride SA (K-DUR,KLOR-CON) 20 MEQ tablet   Oral   Take 20 mEq by mouth 2 (two) times daily.           . quinapril (ACCUPRIL) 20 MG tablet   Oral   Take 1 tablet by mouth Daily.           BP 133/50  Pulse 66  Temp(Src) 98.2 F (36.8 C) (Oral)  Resp 16  Ht 5\' 2"  (1.575 m)  Wt 130 lb (58.968 kg)  BMI 23.77 kg/m2  SpO2 97%  Physical Exam  Nursing note and vitals reviewed. Constitutional: She is oriented to person, place, and time. She appears well-developed and well-nourished.  HENT:  Head: Normocephalic and atraumatic.  Eyes: Conjunctivae and EOM are normal. Pupils are equal, round, and reactive to light.  Neck: Normal range of motion. Neck supple.  Cardiovascular: Normal rate, regular rhythm and normal heart sounds.   Pulmonary/Chest: Effort normal and breath sounds normal.  Abdominal: Soft. Bowel sounds are normal.  Musculoskeletal: Normal range of motion.  Neurological: She is alert and oriented to person, place, and time.  Skin: Skin is warm and dry.  Psychiatric: She has a normal mood and affect.      ED Course  Procedures (including critical care time)  DIAGNOSTIC STUDIES: Oxygen Saturation is 97% on room air, normal by my interpretation.    COORDINATION OF CARE: 10:10 AM-Explained that pt does not appear to be injured and that imaging is most likely unnecessary.  Discussed treatment plan which includes walker use to prevent further falls with pt and sister at bedside and they agreed to plan.       Labs Reviewed - No data to display Dg Hip Bilateral W/pelvis  05/13/2012  *RADIOLOGY REPORT*  Clinical Data: Bilateral hip pain multiple recent falls.  BILATERAL HIP WITH PELVIS - 4+ VIEW  Comparison: 04/03/2012.  Findings: AP view of the pelvis and AP and lateral views of the hips bilaterally demonstrate no definite acute displaced fracture, subluxation, dislocation or joint abnormality.  There is advanced loss of joint space, subchondral sclerosis, subchondral cyst formation and osteophyte formation in the hip joints bilaterally (right much greater than left), compatible with advanced osteoarthritis.  IMPRESSION: 1.  No acute radiographic abnormality of the bony pelvis or the hips bilaterally. 2.  Advanced bilateral hip joint osteoarthritis (right greater than left), as above.   Original Report Authenticated By: Trudie Reed, M.D.      No diagnosis found.    MDM  Accidental mechanical fall. Sister reports normal behavior.  Plain films of pelvis negative. No head or neck trauma.  No neuro deficits.      I personally performed the services described in this documentation, which was scribed in my presence. The recorded information has been reviewed and is accurate.    Anna Hutching, MD 05/13/12 1110

## 2012-05-13 NOTE — ED Notes (Signed)
Pt has slight r leg shortening noted. Pt c/o pain to both hips but states L one always hurts, r hurts worse today. Bruising noted to L knee

## 2012-05-13 NOTE — ED Notes (Signed)
Pt lives at home with sisters. Fell pta. EMS states at least 3 falls this month and each time has had big shoes on. Today now has large socks on. Pt denies LOC. Pt c/o lower back/r hip pain/r leg pain. Pt arrived alert/oriented to most.

## 2012-05-13 NOTE — ED Notes (Signed)
Assisted with voiding by nt-jessica.

## 2012-05-13 NOTE — ED Notes (Signed)
Pt arrived by ems from home, has fallen 3 times in the last few weeks. Did not want to come to ed today, encouraged to come for eval by ems. Pt having right hip pain today. Denies any other injury.

## 2012-06-24 ENCOUNTER — Emergency Department (HOSPITAL_COMMUNITY): Payer: Medicare Other

## 2012-06-24 ENCOUNTER — Encounter (HOSPITAL_COMMUNITY): Payer: Self-pay | Admitting: Emergency Medicine

## 2012-06-24 ENCOUNTER — Emergency Department (HOSPITAL_COMMUNITY)
Admission: EM | Admit: 2012-06-24 | Discharge: 2012-06-24 | Disposition: A | Discharge status: Home / Self Care | Payer: Medicare Other | Attending: Emergency Medicine | Admitting: Emergency Medicine

## 2012-06-24 DIAGNOSIS — S20229A Contusion of unspecified back wall of thorax, initial encounter: Secondary | ICD-10-CM | POA: Insufficient documentation

## 2012-06-24 DIAGNOSIS — Y92009 Unspecified place in unspecified non-institutional (private) residence as the place of occurrence of the external cause: Secondary | ICD-10-CM | POA: Insufficient documentation

## 2012-06-24 DIAGNOSIS — I509 Heart failure, unspecified: Secondary | ICD-10-CM | POA: Insufficient documentation

## 2012-06-24 DIAGNOSIS — E039 Hypothyroidism, unspecified: Secondary | ICD-10-CM | POA: Insufficient documentation

## 2012-06-24 DIAGNOSIS — I10 Essential (primary) hypertension: Secondary | ICD-10-CM | POA: Insufficient documentation

## 2012-06-24 DIAGNOSIS — M542 Cervicalgia: Secondary | ICD-10-CM | POA: Insufficient documentation

## 2012-06-24 DIAGNOSIS — Z8719 Personal history of other diseases of the digestive system: Secondary | ICD-10-CM | POA: Insufficient documentation

## 2012-06-24 DIAGNOSIS — Y9301 Activity, walking, marching and hiking: Secondary | ICD-10-CM | POA: Insufficient documentation

## 2012-06-24 DIAGNOSIS — Z862 Personal history of diseases of the blood and blood-forming organs and certain disorders involving the immune mechanism: Secondary | ICD-10-CM | POA: Insufficient documentation

## 2012-06-24 DIAGNOSIS — Z880 Allergy status to penicillin: Secondary | ICD-10-CM | POA: Insufficient documentation

## 2012-06-24 DIAGNOSIS — R296 Repeated falls: Secondary | ICD-10-CM | POA: Insufficient documentation

## 2012-06-24 DIAGNOSIS — Z79899 Other long term (current) drug therapy: Secondary | ICD-10-CM | POA: Insufficient documentation

## 2012-06-24 LAB — BASIC METABOLIC PANEL
BUN: 14 mg/dL (ref 6–23)
Chloride: 98 mEq/L (ref 96–112)
GFR calc Af Amer: 53 mL/min — ABNORMAL LOW (ref >90)
GFR calc non Af Amer: 46 mL/min — ABNORMAL LOW (ref >90)
Potassium: 3.2 mEq/L — ABNORMAL LOW (ref 3.5–5.1)
Sodium: 142 mEq/L (ref 135–145)

## 2012-06-24 LAB — URINALYSIS, ROUTINE W REFLEX MICROSCOPIC
Nitrite: NEGATIVE
Specific Gravity, Urine: >1.03 — ABNORMAL HIGH (ref 1.005–1.030)
Urobilinogen, UA: 0.2 mg/dL (ref 0.0–1.0)
pH: 5.5 (ref 5.0–8.0)

## 2012-06-24 LAB — CBC WITH DIFFERENTIAL/PLATELET
Basophils Relative: 1 % (ref 0–1)
Eosinophils Absolute: 0.1 10*3/uL (ref 0.0–0.7)
Hemoglobin: 12 g/dL (ref 12.0–15.0)
MCHC: 32.9 g/dL (ref 30.0–36.0)
Monocytes Relative: 9 % (ref 3–12)
Neutro Abs: 4.1 10*3/uL (ref 1.7–7.7)
Neutrophils Relative %: 72 % (ref 43–77)
Platelets: 302 10*3/uL (ref 150–400)
RBC: 3.86 MIL/uL — ABNORMAL LOW (ref 3.87–5.11)

## 2012-06-24 LAB — URINE MICROSCOPIC-ADD ON

## 2012-06-24 MED ORDER — TRAMADOL-ACETAMINOPHEN 37.5-325 MG PO TABS: 1.0000 | ORAL_TABLET | Freq: Four times a day (QID) | ORAL | Status: DC | PRN

## 2012-06-24 MED ORDER — TRAMADOL HCL 50 MG PO TABS
50.0000 mg | ORAL_TABLET | Freq: Once | ORAL | Status: AC
  Administered 2012-06-24: 50 mg via ORAL
  Filled 2012-06-24: qty 1

## 2012-06-24 NOTE — ED Provider Notes (Signed)
History  This chart was scribed for Flint Melter, MD by Bennett Scrape, ED Scribe. This patient was seen in room APA18/APA18 and the patient's care was started at 2:34 PM.  CSN: [id number]  Arrival date & time 06/24/12  1424   First MD Initiated Contact with Patient 06/24/12 1434      Chief Complaint  Patient presents with  . Fall  . Back Pain   Level 5 Caveat-confusion   The history is provided by the patient and the EMS personnel. No language interpreter was used.    HPI Comments: Anna House is a 79 y.o. female brought in by ambulance fully immobilized, who presents to the Emergency Department complaining of a fall. Pt states that she usually walks with a walker but was not using one today and fell. She is c/o back pain currently.    Past Medical History  Diagnosis Date  . Hypertension   . CHF (congestive heart failure)   . Hypothyroid   . Anemia   . Cleft palate     Past Surgical History  Procedure Laterality Date  . Colonoscopy  01/09/2011    Procedure: COLONOSCOPY;  Surgeon: Malissa Hippo, MD;  Location: [facility];  Service: Endoscopy;  Laterality: N/A;  2:00 pm    History reviewed. No pertinent family history.  History  Substance Use Topics  . Smoking status: Never Smoker   . Smokeless tobacco: Never Used  . Alcohol Use: No    No OB history provided.  Review of Systems  Unable to perform ROS: Other    Allergies  Penicillins  Home Medications   Current Outpatient Rx  Name  Route  Sig  Dispense  Refill  . amiodarone (PACERONE) 200 MG tablet   Oral   Take 1 tablet by mouth Daily.         Marland Kitchen amLODipine (NORVASC) 5 MG tablet   Oral   Take 1 tablet (5 mg total) by mouth daily.   30 tablet   6     Dose increased 09/09/2011.   . carvedilol (COREG) 3.125 MG tablet   Oral   Take 1 tablet by mouth Twice daily.         . clonazePAM (KLONOPIN) 0.5 MG tablet   Oral   Take 0.5 mg by mouth 4 (four) times daily.         . furosemide  (LASIX) 40 MG tablet   Oral   Take 40 mg by mouth 2 (two) times daily.           . hydrOXYzine (ATARAX/VISTARIL) 25 MG tablet   Oral   Take 25 mg by mouth at bedtime.          Marland Kitchen labetalol (NORMODYNE) 200 MG tablet   Oral   Take 600 mg by mouth 2 (two) times daily.         Marland Kitchen levothyroxine (SYNTHROID, LEVOTHROID) 100 MCG tablet   Oral   Take 100 mcg by mouth daily before breakfast.         . montelukast (SINGULAIR) 10 MG tablet   Oral   Take 10 mg by mouth daily.           . quinapril (ACCUPRIL) 20 MG tablet   Oral   Take 1 tablet by mouth Daily.         . Vitamin D, Ergocalciferol, (DRISDOL) 50000 UNITS CAPS   Oral   Take 50,000 Units by mouth every 7 (seven) days.         Marland Kitchen  traMADol-acetaminophen (ULTRACET) 37.5-325 MG per tablet   Oral   Take 1 tablet by mouth every 6 (six) hours as needed for pain.   30 tablet   0     Triage Vitals: BP 152/54  Pulse 58  Temp(Src) 98.3 F (36.8 C) (Oral)  Resp 20  SpO2 94%  Physical Exam  Nursing note and vitals reviewed. Constitutional: She appears well-developed and well-nourished. Cervical collar in place.  HENT:  Head: Normocephalic and atraumatic.  Neck: Phonation normal.  Tenderness to the c-spine  Musculoskeletal: Normal range of motion.  T-spine and L-spine are non-tender, no step offs  Neurological: She has normal strength.    ED Course  Procedures (including critical care time) Medications  traMADol (ULTRAM) tablet 50 mg (50 mg Oral Given 06/24/12 1712)    Patient Vitals for the past 24 hrs:  BP Temp Temp src Pulse Resp SpO2  06/24/12 1713 122/48 mmHg - - 74 18 98 %  06/24/12 1443 152/54 mmHg 98.3 F (36.8 C) Oral 58 20 94 %     DIAGNOSTIC STUDIES: Oxygen Saturation is 94% on room air, adequate by my interpretation.    COORDINATION OF CARE: 2:36 PM-Discussed treatment plan which includes CT of head and c-spine, xray of t-spine and l-spine, CBC panel, BMP and UA with pt at bedside and pt  agreed to plan.    5:20 PM Reevaluation with update and discussion. After initial assessment and treatment, an updated evaluation reveals Sisters in room. Ambulating per her usual. Kiasia Chou L    Labs Reviewed  CBC WITH DIFFERENTIAL - Abnormal; Notable for the following:    RBC 3.86 (*)    All other components within normal limits  BASIC METABOLIC PANEL - Abnormal; Notable for the following:    Potassium 3.2 (*)    CO2 38 (*)    Glucose, Bld 115 (*)    Creatinine, Ser 1.14 (*)    GFR calc non Af Amer 46 (*)    GFR calc Af Amer 53 (*)    All other components within normal limits  URINALYSIS, ROUTINE W REFLEX MICROSCOPIC - Abnormal; Notable for the following:    Specific Gravity, Urine >1.030 (*)    Bilirubin Urine SMALL (*)    Ketones, ur TRACE (*)    Protein, ur 100 (*)    Leukocytes, UA TRACE (*)    All other components within normal limits  URINE MICROSCOPIC-ADD ON - Abnormal; Notable for the following:    Bacteria, UA MANY (*)    All other components within normal limits  URINE CULTURE   Dg Thoracic Spine W/swimmers  06/24/2012   *RADIOLOGY REPORT*  Clinical Data: Fall.  Back pain.  Compression fracture.  THORACIC SPINE - 2 VIEW + SWIMMERS  Comparison: 12/19/2011 radiographs.  Findings: Thoracic spinal alignment is unchanged.  No compression fractures are identified.  Multilevel thoracic spondylosis. Cholecystectomy clips are present in the right upper quadrant. Mild levoconvex thoracolumbar curvature with the apex at T12-L1.  IMPRESSION: No acute abnormality or interval change.   Original Report Authenticated By: Andreas Newport, M.D.   Dg Lumbar Spine Complete  06/24/2012   *RADIOLOGY REPORT*  Clinical Data: Fall.  Back pain.  LUMBAR SPINE - COMPLETE 4+ VIEW  Comparison: Lumbar spine radiographs 12/19/2011.  Findings: Mild levoconvex curvature appears similar to the prior exam.  Phleboliths noted. Cholecystectomy clips are present in the right upper quadrant.  Large stool  burden in the abdomen.  No lumbar spine compression fracture is present.  Multilevel spondylosis,  most pronounced at L5- S1.  The visualized lower thoracic spine appears within normal limits.  Lumbosacral junction normal.  IMPRESSION: Lumbar spondylosis without interval change or acute abnormality.   Original Report Authenticated By: Andreas Newport, M.D.   Ct Head Wo Contrast  06/24/2012   *RADIOLOGY REPORT*  Clinical Data:  78 year old female status post fall.  Immobilized.  CT HEAD WITHOUT CONTRAST CT CERVICAL SPINE WITHOUT CONTRAST  Technique:  Multidetector CT imaging of the head and cervical spine was performed following the standard protocol without intravenous contrast.  Multiplanar CT image reconstructions of the cervical spine were also generated.  Comparison:  04/03/2012 and earlier.  CT HEAD  Findings: Stable paranasal sinuses and mastoids (previous mastoidectomies.  No acute orbit soft tissue findings.  No scalp hematoma identified.  Calvarium intact.  Stable cerebral volume. Stable ventricle size and configuration. No midline shift, mass effect, or evidence of mass lesion.  Stable cerebral white matter hypodensity.  Chronic left thalamic lacunar infarct. No evidence of cortically based acute infarction identified.  No acute intracranial hemorrhage identified.  IMPRESSION: 1. No acute intracranial abnormality.  No acute traumatic injury identified. 2.  Cervical spine findings below.  CT CERVICAL SPINE  Findings: Stable cervical vertebral height and alignment with reversed upper cervical lordosis.  Chronic C1-C2 degenerative changes. Visualized skull base is intact.  No atlanto-occipital dissociation.  Bilateral posterior element alignment is within normal limits.  Cervicothoracic junction alignment is within normal limits.  Intermittent mild motion artifact.  No acute cervical spine fracture identified.  Negative lung apices.  Visualized upper thoracic levels appear grossly intact.  Incidental  retropharyngeal course of both carotid arteries. Visualized paraspinal soft tissues are within normal limits.  IMPRESSION: No acute fracture or listhesis identified in the cervical spine. Ligamentous injury is not excluded.   Original Report Authenticated By: Erskine Speed, M.D.   Ct Cervical Spine Wo Contrast  06/24/2012   *RADIOLOGY REPORT*  Clinical Data:  79 year old female status post fall.  Immobilized.  CT HEAD WITHOUT CONTRAST CT CERVICAL SPINE WITHOUT CONTRAST  Technique:  Multidetector CT imaging of the head and cervical spine was performed following the standard protocol without intravenous contrast.  Multiplanar CT image reconstructions of the cervical spine were also generated.  Comparison:  04/03/2012 and earlier.  CT HEAD  Findings: Stable paranasal sinuses and mastoids (previous mastoidectomies.  No acute orbit soft tissue findings.  No scalp hematoma identified.  Calvarium intact.  Stable cerebral volume. Stable ventricle size and configuration. No midline shift, mass effect, or evidence of mass lesion.  Stable cerebral white matter hypodensity.  Chronic left thalamic lacunar infarct. No evidence of cortically based acute infarction identified.  No acute intracranial hemorrhage identified.  IMPRESSION: 1. No acute intracranial abnormality.  No acute traumatic injury identified. 2.  Cervical spine findings below.  CT CERVICAL SPINE  Findings: Stable cervical vertebral height and alignment with reversed upper cervical lordosis.  Chronic C1-C2 degenerative changes. Visualized skull base is intact.  No atlanto-occipital dissociation.  Bilateral posterior element alignment is within normal limits.  Cervicothoracic junction alignment is within normal limits.  Intermittent mild motion artifact.  No acute cervical spine fracture identified.  Negative lung apices.  Visualized upper thoracic levels appear grossly intact.  Incidental retropharyngeal course of both carotid arteries. Visualized paraspinal soft  tissues are within normal limits.  IMPRESSION: No acute fracture or listhesis identified in the cervical spine. Ligamentous injury is not excluded.   Original Report Authenticated By: Erskine Speed, M.D.  1. Fall at home, initial encounter   2. Contusion of back, unspecified laterality, initial encounter       MDM  Fall without clear cause, but negative screening evaluation for acute medical problems. No traumatic injuries in the fall. Moderate lumbar spondylosis likely the aggravating cause of her pain. Doubt metabolic instability, serious bacterial infection or impending vascular collapse; the patient is stable for discharge.  Nursing Notes Reviewed/ Care Coordinated, and agree without changes. Applicable Imaging Reviewed. Radiologic imaging report reviewed and images by CT and radiography  - viewed, by me. Interpretation of Laboratory Data incorporated into ED treatment   Plan: Home Medications- Ultracet; Home Treatments- rest, heat; Recommended follow up- PCP, when necessary    I personally performed the services described in this documentation, which was scribed in my presence. The recorded information has been reviewed and is accurate.          Flint Melter, MD 06/24/12 [phone]

## 2012-06-24 NOTE — ED Notes (Signed)
Ambulatory in hall, approx 40 feet; gait steady with minimal assistance.

## 2012-06-24 NOTE — ED Notes (Signed)
Patient with no complaints at this time. Respirations even and unlabored. Skin warm/dry. Discharge instructions reviewed with patient at this time. Patient given opportunity to voice concerns/ask questions. Patient discharged at this time and left Emergency Department with steady gait.   

## 2012-06-24 NOTE — ED Notes (Signed)
Pt usually walks with walker, pt was not using walker and fell. Pt complaining of back pain.

## 2012-06-26 LAB — URINE CULTURE: Colony Count: >=100000

## 2012-06-27 ENCOUNTER — Telehealth (HOSPITAL_COMMUNITY): Payer: Self-pay | Admitting: Emergency Medicine

## 2012-06-27 NOTE — Progress Notes (Signed)
  ED Antimicrobial Stewardship Positive Culture Follow Up   Anna House is an 79 y.o. female who presented to [facility] on 06/24/2012 with a chief complaint of fall, backpain.   Chief Complaint  Patient presents with  . Fall  . Back Pain    Recent Results (from the past 720 hour(s))  URINE CULTURE     Status: None   Collection Time    06/24/12  3:14 PM      Result Value Range Status   Specimen Description URINE, CATHETERIZED   Final   Special Requests NONE   Final   Culture  Setup Time 06/25/2012 03:33   Final   Colony Count >=100,000 COLONIES/ML   Final   Culture KLEBSIELLA PNEUMONIAE   Final   Report Status 06/26/2012 FINAL   Final   Organism ID, Bacteria KLEBSIELLA PNEUMONIAE   Final   CrCl ~36 ml/min  []  Treated with , organism resistant to prescribed antimicrobial [x]  Patient discharged originally without antimicrobial agent and treatment is now indicated  New antibiotic prescription: Cipro 250mg  PO BID x 7 days  ED Provider: Roxy Horseman, PA-C   Cleon Dew 06/27/2012, 9:46 PM Infectious Diseases Pharmacist Phone# [phone]

## 2012-06-27 NOTE — ED Notes (Signed)
Post ED Visit - Positive Culture Follow-up: Successful Patient Follow-Up  Culture assessed and recommendations reviewed by: [x]  Wes Dulaney, Pharm.D., BCPS []  Celedonio Miyamoto, Pharm.D., BCPS []  Georgina Pillion, [address].D., BCPS []  [place], [address].D., BCPS, AAHIVP []  Estella Husk, Pharm.D., BCPS, AAHIVP  Positive urine culture  [x]  Patient discharged without antimicrobial prescription and treatment is now indicated []  Organism is resistant to prescribed ED discharge antimicrobial []  Patient with positive blood cultures  Changes discussed with ED provider: Roxy Horseman PA-C New antibiotic prescription: Cipro 250 mg PO BID x 7 days    Kylie A Holland 06/27/2012, 6:27 PM

## 2012-06-29 ENCOUNTER — Encounter: Payer: Medicare Other | Admitting: Cardiology

## 2012-06-29 ENCOUNTER — Telehealth (HOSPITAL_COMMUNITY): Payer: Self-pay | Admitting: Emergency Medicine

## 2012-06-29 ENCOUNTER — Encounter: Payer: Self-pay | Admitting: Cardiology

## 2012-06-29 NOTE — ED Notes (Signed)
Post ED Visit - Positive Culture Follow-up: Successful Patient Follow-Up  Culture assessed and recommendations reviewed by: [x]  Wes Dulaney, Pharm.D., BCPS []  Celedonio Miyamoto, Pharm.D., BCPS []  Georgina Pillion, Pharm.D., BCPS []  [place], [address].D., BCPS, AAHIVP []  Estella Husk, Pharm.D., BCPS, AAHIVP  Positive urine culture  [x]  Patient discharged without antimicrobial prescription and treatment is now indicated []  Organism is resistant to prescribed ED discharge antimicrobial []  Patient with positive blood cultures  Changes discussed with ED provider:Robert Dahlia Client New antibiotic prescription Cipro 250 mg po BID x 7 days Called to [facility] [phone] Contacted patient, date 06/29/2012, time 1833   Larena Sox 06/29/2012, 6:32 PM

## 2012-06-29 NOTE — Progress Notes (Signed)
Canceled   This encounter was created in error - please disregard.

## 2012-07-02 ENCOUNTER — Encounter: Payer: Self-pay | Admitting: Cardiology

## 2012-07-02 ENCOUNTER — Ambulatory Visit (INDEPENDENT_AMBULATORY_CARE_PROVIDER_SITE_OTHER): Payer: Medicare Other | Admitting: Cardiology

## 2012-07-02 VITALS — BP 129/58 | HR 60 | Ht 62.0 in | Wt 125.0 lb

## 2012-07-02 DIAGNOSIS — I4891 Unspecified atrial fibrillation: Secondary | ICD-10-CM

## 2012-07-02 DIAGNOSIS — E785 Hyperlipidemia, unspecified: Secondary | ICD-10-CM

## 2012-07-02 DIAGNOSIS — I10 Essential (primary) hypertension: Secondary | ICD-10-CM

## 2012-07-02 DIAGNOSIS — I429 Cardiomyopathy, unspecified: Secondary | ICD-10-CM

## 2012-07-02 MED ORDER — AMIODARONE HCL 200 MG PO TABS: 100.0000 mg | ORAL_TABLET | Freq: Every day | ORAL | Status: AC

## 2012-07-02 NOTE — Patient Instructions (Addendum)
Your physician recommends that you schedule a follow-up appointment in: 6 months. You will receive a reminder letter in the mail in about 4 months reminding you to call and schedule your appointment. If you don't receive this letter, please contact our office. Your physician has recommended you make the following change in your medication: Decrease your amiodarone to 100 mg daily. Please break your 200 mg tablet in 1/2 daily. Your new prescription has been sent to your pharmacy. All other medications will remain the same. Your physician recommends that you have  lab work today TSH and liver function test at [facility].

## 2012-07-02 NOTE — Assessment & Plan Note (Signed)
Documented in the past, also with prior wide complex tachycardia and a left bundle branch block at baseline. She has been managed conservatively with associated cardiomyopathy, a poor candidate for anticoagulation. Would recommend reducing amiodarone to 100 mg daily. Followup TSH and LFTs. Followup arranged.

## 2012-07-02 NOTE — Assessment & Plan Note (Signed)
Blood pressure control is reasonable today. 

## 2012-07-02 NOTE — Progress Notes (Signed)
Clinical Summary Anna House is a 79 y.o.female presenting for office followup. She is a former patient of Dr. Andee Lineman, last seen in August 2013. History is reviewed below. She has been managed conservatively with history of cardiomyopathy and PAF, deemed a poor candidate for anticoagulation or device treatment. She has been on amiodarone for rhythm control. Dr. Virgina Organ recently saw her in the office and questioned whether she needed to stay on amiodarone or not.  Recent record review finds evaluation at [facility] after fall. She had a contusion of her back, also findings of Klebsiella UTI.  She is here with her sister. Difficult to understand with speech impediment. She is reported to be very unsteady on her feet, uses a walker, still has frequent falls. She reports no palpitations, chest pain, or syncope. No definite ataxia.  I could not locate any recent thyroid studies or LFTs. She is on Synthroid for hypothyroidism.   Allergies  Allergen Reactions  . Penicillins     Current Outpatient Prescriptions  Medication Sig Dispense Refill  . amiodarone (PACERONE) 200 MG tablet Take 0.5 tablets (100 mg total) by mouth daily.  15 tablet  6  . amLODipine (NORVASC) 5 MG tablet Take 1 tablet (5 mg total) by mouth daily.  30 tablet  6  . furosemide (LASIX) 40 MG tablet Take 40 mg by mouth 2 (two) times daily.        Marland Kitchen labetalol (NORMODYNE) 200 MG tablet Take 600 mg by mouth 2 (two) times daily.      Marland Kitchen levothyroxine (SYNTHROID, LEVOTHROID) 100 MCG tablet Take 100 mcg by mouth daily before breakfast.      . quinapril (ACCUPRIL) 20 MG tablet Take 1 tablet by mouth Daily.      . traMADol (ULTRAM) 50 MG tablet Take 1 tablet by mouth 2 (two) times daily.      . Vitamin D, Ergocalciferol, (DRISDOL) 50000 UNITS CAPS Take 50,000 Units by mouth every 30 (thirty) days.        No current facility-administered medications for this visit.    Past Medical History  Diagnosis Date  . Essential hypertension,  benign   . Atrial fibrillation   . Hypothyroidism   . Left bundle branch block   . Cleft palate   . Cardiomyopathy     LVEF 30%, possibly ischemic - small inferoapical ischemic defect by Cardiolite 2012  . Pulmonary hypertension     Social History Anna House reports that she has never smoked. She has never used smokeless tobacco. Anna House reports that she does not drink alcohol.  Review of Systems As outlined above. No leg edema. No orthopnea. Otherwise negative.  Physical Examination Filed Vitals:   07/02/12 1317  BP: 129/58  Pulse: 60   Filed Weights   07/02/12 1317  Weight: 125 lb (56.7 kg)   Comfortable at rest. HEENT: Conjunctiva and lids normal, oropharynx clear. Neck: Supple, no elevated JVP or carotid bruits, no thyromegaly. Lungs: Clear to auscultation, nonlabored breathing at rest. Cardiac: Regular rate and rhythm, no S3, soft systolic murmur. Abdomen: Soft, nontender,bowel sounds present. Extremities: No pitting edema, distal pulses 2+. Skin: Warm and dry. Musculoskeletal: Mild kyphosis. Neuropsychiatric: Alert and oriented x3, affect grossly appropriate.   Problem List and Plan   Atrial fibrillation Documented in the past, also with prior wide complex tachycardia and a left bundle branch block at baseline. She has been managed conservatively with associated cardiomyopathy, a poor candidate for anticoagulation. Would recommend reducing amiodarone to 100 mg daily.  Followup TSH and LFTs. Followup arranged.  Secondary cardiomyopathy, unspecified Possibly ischemic, small inferoapical defect on prior Cardiolite. She has been managed conservatively. No plan for cardiac catheterization or device therapy. Symptomatically stable.  Essential hypertension, benign Blood pressure control is reasonable today.  HYPERLIPIDEMIA-MIXED Followed by Dr. Virgina Organ.    Jonelle Sidle, M.D., F.A.C.C.

## 2012-07-02 NOTE — Assessment & Plan Note (Signed)
Followed by Dr. Virgina Organ.

## 2012-07-02 NOTE — Assessment & Plan Note (Signed)
Possibly ischemic, small inferoapical defect on prior Cardiolite. She has been managed conservatively. No plan for cardiac catheterization or device therapy. Symptomatically stable.

## 2012-07-13 ENCOUNTER — Ambulatory Visit: Payer: Medicare Other | Admitting: Cardiology

## 2013-01-03 ENCOUNTER — Emergency Department (HOSPITAL_COMMUNITY): Payer: Medicare Other

## 2013-01-03 ENCOUNTER — Inpatient Hospital Stay (HOSPITAL_COMMUNITY)
Admission: EM | Admit: 2013-01-03 | Discharge: 2013-01-20 | DRG: 088 | Disposition: E | Payer: Medicare Other | Attending: Internal Medicine | Admitting: Internal Medicine

## 2013-01-03 ENCOUNTER — Encounter (HOSPITAL_COMMUNITY): Payer: Self-pay | Admitting: Emergency Medicine

## 2013-01-03 DIAGNOSIS — I447 Left bundle-branch block, unspecified: Secondary | ICD-10-CM | POA: Diagnosis present

## 2013-01-03 DIAGNOSIS — S02400A Malar fracture unspecified, initial encounter for closed fracture: Secondary | ICD-10-CM

## 2013-01-03 DIAGNOSIS — G9340 Encephalopathy, unspecified: Secondary | ICD-10-CM

## 2013-01-03 DIAGNOSIS — S0280XA Fracture of other specified skull and facial bones, unspecified side, initial encounter for closed fracture: Secondary | ICD-10-CM

## 2013-01-03 DIAGNOSIS — S02401A Maxillary fracture, unspecified, initial encounter for closed fracture: Secondary | ICD-10-CM

## 2013-01-03 DIAGNOSIS — Z515 Encounter for palliative care: Secondary | ICD-10-CM

## 2013-01-03 DIAGNOSIS — I619 Nontraumatic intracerebral hemorrhage, unspecified: Secondary | ICD-10-CM

## 2013-01-03 DIAGNOSIS — S060X9A Concussion with loss of consciousness of unspecified duration, initial encounter: Principal | ICD-10-CM

## 2013-01-03 DIAGNOSIS — I639 Cerebral infarction, unspecified: Secondary | ICD-10-CM

## 2013-01-03 DIAGNOSIS — I428 Other cardiomyopathies: Secondary | ICD-10-CM | POA: Diagnosis present

## 2013-01-03 DIAGNOSIS — Z66 Do not resuscitate: Secondary | ICD-10-CM | POA: Diagnosis not present

## 2013-01-03 DIAGNOSIS — I4891 Unspecified atrial fibrillation: Secondary | ICD-10-CM

## 2013-01-03 DIAGNOSIS — I10 Essential (primary) hypertension: Secondary | ICD-10-CM | POA: Diagnosis present

## 2013-01-03 DIAGNOSIS — R1110 Vomiting, unspecified: Secondary | ICD-10-CM

## 2013-01-03 DIAGNOSIS — Y92009 Unspecified place in unspecified non-institutional (private) residence as the place of occurrence of the external cause: Secondary | ICD-10-CM

## 2013-01-03 DIAGNOSIS — J9601 Acute respiratory failure with hypoxia: Secondary | ICD-10-CM

## 2013-01-03 DIAGNOSIS — E876 Hypokalemia: Secondary | ICD-10-CM | POA: Diagnosis present

## 2013-01-03 DIAGNOSIS — S0285XA Fracture of orbit, unspecified, initial encounter for closed fracture: Secondary | ICD-10-CM

## 2013-01-03 DIAGNOSIS — E870 Hyperosmolality and hypernatremia: Secondary | ICD-10-CM

## 2013-01-03 DIAGNOSIS — M87059 Idiopathic aseptic necrosis of unspecified femur: Secondary | ICD-10-CM | POA: Diagnosis present

## 2013-01-03 DIAGNOSIS — E039 Hypothyroidism, unspecified: Secondary | ICD-10-CM | POA: Diagnosis present

## 2013-01-03 DIAGNOSIS — I509 Heart failure, unspecified: Secondary | ICD-10-CM | POA: Diagnosis present

## 2013-01-03 DIAGNOSIS — W108XXA Fall (on) (from) other stairs and steps, initial encounter: Secondary | ICD-10-CM

## 2013-01-03 DIAGNOSIS — W19XXXA Unspecified fall, initial encounter: Secondary | ICD-10-CM | POA: Diagnosis present

## 2013-01-03 DIAGNOSIS — I5042 Chronic combined systolic (congestive) and diastolic (congestive) heart failure: Secondary | ICD-10-CM

## 2013-01-03 DIAGNOSIS — J690 Pneumonitis due to inhalation of food and vomit: Secondary | ICD-10-CM

## 2013-01-03 DIAGNOSIS — J9600 Acute respiratory failure, unspecified whether with hypoxia or hypercapnia: Secondary | ICD-10-CM | POA: Diagnosis present

## 2013-01-03 DIAGNOSIS — W102XXA Fall (on)(from) incline, initial encounter: Secondary | ICD-10-CM

## 2013-01-03 DIAGNOSIS — G936 Cerebral edema: Secondary | ICD-10-CM | POA: Diagnosis not present

## 2013-01-03 HISTORY — DX: Repeated falls: R29.6

## 2013-01-03 HISTORY — DX: Paroxysmal atrial fibrillation: I48.0

## 2013-01-03 HISTORY — DX: Fracture of orbital floor, left side, initial encounter for closed fracture: S02.32XA

## 2013-01-03 HISTORY — DX: Concussion with loss of consciousness of unspecified duration, initial encounter: S06.0X9A

## 2013-01-03 HISTORY — DX: Concussion with loss of consciousness status unknown, initial encounter: S06.0XAA

## 2013-01-03 LAB — URINALYSIS, ROUTINE W REFLEX MICROSCOPIC
Glucose, UA: NEGATIVE mg/dL
Hgb urine dipstick: NEGATIVE
Ketones, ur: NEGATIVE mg/dL
Leukocytes, UA: NEGATIVE
Protein, ur: 100 mg/dL — AB
Specific Gravity, Urine: 1.015 (ref 1.005–1.030)
pH: 7 (ref 5.0–8.0)

## 2013-01-03 LAB — BASIC METABOLIC PANEL
BUN: 8 mg/dL (ref 6–23)
CO2: 28 mEq/L (ref 19–32)
Calcium: 8.9 mg/dL (ref 8.4–10.5)
Chloride: 102 mEq/L (ref 96–112)
Creatinine, Ser: 0.62 mg/dL (ref 0.50–1.10)
GFR calc Af Amer: >90 mL/min (ref >90)
Glucose, Bld: 96 mg/dL (ref 70–99)
Potassium: 4.4 mEq/L (ref 3.5–5.1)

## 2013-01-03 LAB — POCT I-STAT TROPONIN I: Troponin i, poc: 0.01 ng/mL (ref 0.00–0.08)

## 2013-01-03 LAB — BLOOD GAS, ARTERIAL
Acid-Base Excess: 5.3 mmol/L — ABNORMAL HIGH (ref 0.0–2.0)
Drawn by: 275531
O2 Content: 4 L/min
O2 Saturation: 99.7 %
Patient temperature: 98.6
TCO2: 31.2 mmol/L (ref 0–100)
pCO2 arterial: 47.8 mmHg — ABNORMAL HIGH (ref 35.0–45.0)

## 2013-01-03 LAB — CBC WITH DIFFERENTIAL/PLATELET
Basophils Relative: 0 % (ref 0–1)
Eosinophils Absolute: 0.4 10*3/uL (ref 0.0–0.7)
Eosinophils Relative: 2 % (ref 0–5)
Hemoglobin: 12.5 g/dL (ref 12.0–15.0)
MCH: 29.2 pg (ref 26.0–34.0)
MCHC: 31.6 g/dL (ref 30.0–36.0)
MCV: 92.5 fL (ref 78.0–100.0)
Monocytes Relative: 6 % (ref 3–12)
Neutrophils Relative %: 73 % (ref 43–77)

## 2013-01-03 LAB — COMPREHENSIVE METABOLIC PANEL
Albumin: 3.6 g/dL (ref 3.5–5.2)
Alkaline Phosphatase: 83 U/L (ref 39–117)
BUN: 13 mg/dL (ref 6–23)
Calcium: 9.3 mg/dL (ref 8.4–10.5)
Creatinine, Ser: 0.93 mg/dL (ref 0.50–1.10)
Potassium: 2.9 mEq/L — ABNORMAL LOW (ref 3.5–5.1)
Total Protein: 7.5 g/dL (ref 6.0–8.3)

## 2013-01-03 LAB — CK: Total CK: 50 U/L (ref 7–177)

## 2013-01-03 LAB — APTT: aPTT: 30 seconds (ref 24–37)

## 2013-01-03 LAB — MRSA PCR SCREENING: MRSA by PCR: NEGATIVE

## 2013-01-03 LAB — PROTIME-INR
INR: 0.99 (ref 0.00–1.49)
Prothrombin Time: 12.9 seconds (ref 11.6–15.2)

## 2013-01-03 LAB — PRO B NATRIURETIC PEPTIDE: Pro B Natriuretic peptide (BNP): 5356 pg/mL — ABNORMAL HIGH (ref 0–450)

## 2013-01-03 LAB — URINE MICROSCOPIC-ADD ON

## 2013-01-03 LAB — SAMPLE TO BLOOD BANK

## 2013-01-03 LAB — DIGOXIN LEVEL: Digoxin Level: 1.1 ng/mL (ref 0.8–2.0)

## 2013-01-03 LAB — CG4 I-STAT (LACTIC ACID): Lactic Acid, Venous: 2.2 mmol/L (ref 0.5–2.2)

## 2013-01-03 MED ORDER — ACETAMINOPHEN 650 MG RE SUPP
650.0000 mg | RECTAL | Status: DC | PRN
  Administered 2013-01-03 – 2013-01-12 (×4): 650 mg via RECTAL
  Filled 2013-01-03 (×5): qty 1

## 2013-01-03 MED ORDER — LABETALOL HCL 5 MG/ML IV SOLN
10.0000 mg | INTRAVENOUS | Status: DC | PRN
  Filled 2013-01-03: qty 4

## 2013-01-03 MED ORDER — SODIUM CHLORIDE 0.9 % IV SOLN
INTRAVENOUS | Status: DC
  Administered 2013-01-04 – 2013-01-05 (×2): via INTRAVENOUS

## 2013-01-03 MED ORDER — SODIUM CHLORIDE 0.9 % IJ SOLN
3.0000 mL | Freq: Two times a day (BID) | INTRAMUSCULAR | Status: DC
  Administered 2013-01-03 – 2013-01-05 (×5): 3 mL via INTRAVENOUS
  Administered 2013-01-09: 10 mL via INTRAVENOUS
  Administered 2013-01-10 – 2013-01-12 (×5): 3 mL via INTRAVENOUS

## 2013-01-03 MED ORDER — LEVOTHYROXINE SODIUM 100 MCG IV SOLR
50.0000 ug | Freq: Every day | INTRAVENOUS | Status: DC
  Administered 2013-01-03 – 2013-01-09 (×7): 50 ug via INTRAVENOUS
  Filled 2013-01-03 (×9): qty 5

## 2013-01-03 MED ORDER — POTASSIUM CHLORIDE 10 MEQ/100ML IV SOLN
10.0000 meq | INTRAVENOUS | Status: AC
  Administered 2013-01-03 (×4): 10 meq via INTRAVENOUS
  Filled 2013-01-03 (×4): qty 100

## 2013-01-03 MED ORDER — LABETALOL HCL 300 MG PO TABS
600.0000 mg | ORAL_TABLET | Freq: Two times a day (BID) | ORAL | Status: DC
  Filled 2013-01-03 (×2): qty 2

## 2013-01-03 MED ORDER — LABETALOL HCL 5 MG/ML IV SOLN
20.0000 mg | Freq: Once | INTRAVENOUS | Status: AC
  Administered 2013-01-03: 20 mg via INTRAVENOUS
  Filled 2013-01-03: qty 4

## 2013-01-03 MED ORDER — AMIODARONE HCL 100 MG PO TABS
100.0000 mg | ORAL_TABLET | Freq: Every day | ORAL | Status: DC
  Filled 2013-01-03: qty 1

## 2013-01-03 MED ORDER — AMLODIPINE BESYLATE 5 MG PO TABS
5.0000 mg | ORAL_TABLET | Freq: Every day | ORAL | Status: DC
  Filled 2013-01-03: qty 1

## 2013-01-03 MED ORDER — QUINAPRIL HCL 10 MG PO TABS
20.0000 mg | ORAL_TABLET | Freq: Every day | ORAL | Status: DC
  Filled 2013-01-03: qty 2

## 2013-01-03 MED ORDER — LEVOTHYROXINE SODIUM 100 MCG PO TABS
100.0000 ug | ORAL_TABLET | Freq: Every day | ORAL | Status: DC
  Filled 2013-01-03 (×2): qty 1

## 2013-01-03 MED ORDER — METOPROLOL TARTRATE 1 MG/ML IV SOLN
5.0000 mg | Freq: Four times a day (QID) | INTRAVENOUS | Status: DC
  Administered 2013-01-03 – 2013-01-11 (×29): 5 mg via INTRAVENOUS
  Filled 2013-01-03 (×39): qty 5

## 2013-01-03 MED ORDER — ONDANSETRON HCL 4 MG/2ML IJ SOLN
4.0000 mg | Freq: Once | INTRAMUSCULAR | Status: AC
  Administered 2013-01-03: 4 mg via INTRAVENOUS
  Filled 2013-01-03: qty 2

## 2013-01-03 NOTE — ED Notes (Signed)
Pt arrived to the ED with remains of vomit on blanket and was still having  Dry heaves, pt was cleaned up and placed on monitor with EDP at bedside. Pt was able to maintain airway, has gag reflux present and her mouth was suctioned from vomit remains. PEARL present and pt moving extremities.

## 2013-01-03 NOTE — ED Notes (Signed)
Attempted to call admitting MD. Unable to contact. Office stated he will be back tomorrow.

## 2013-01-03 NOTE — ED Notes (Signed)
Called Company secretary concerning bed placement. Flow manager stated there were no stepdown beds currently available.

## 2013-01-03 NOTE — ED Notes (Signed)
Family informed about wait for SD bed.

## 2013-01-03 NOTE — ED Notes (Signed)
Family at bedside. Pt family states that she was awake and walking to the bathroom and tripped over a table leg. Pt went straight down and landed on her face/head.

## 2013-01-03 NOTE — ED Notes (Addendum)
Per EMS: pt was walking with walker and she fell. Pt was laying on her side when she was found by EMS. Pt in a-fib RVR and vomited twice en route. Pt moving extremities but not very purposely with EMS. Pt placed on side after vomiting.

## 2013-01-03 NOTE — ED Notes (Signed)
Pt did not have urine in foley and was checked with bladder scanner (found to have of urine with bladder scanner) and visual inspection showed that the catheter tip was out on the bed, pt was incontinent and urine on sheets. I reattempted to insert a new foley and it was met with resistance. Pt changed and cleaned up, linens changed and new gown applied.

## 2013-01-03 NOTE — ED Notes (Signed)
Pt vomited after being sat back up from CT scan, pt suctioned and cleaned.

## 2013-01-03 NOTE — Progress Notes (Signed)
PT Cancellation Note  Patient Details Name: KAMPBELL HOLAWAY MRN: [id number] DOB: 01-Mar-1933   Cancelled Treatment:    Reason Eval/Treat Not Completed: Medical issues which prohibited therapy.  Dr. Sharon Seller wants PT to begin tomorrow.  Thanks.   INGOLD,Amiel Sharrow 12/20/2012, 3:57 PM [facility] [phone] (pager)

## 2013-01-03 NOTE — H&P (Signed)
[facility] History and Physical  MAAYAN JENNING House:[id number] DOB: 1933-11-02 DOA: 31-Jan-2013  Referring physician: EDP PCP: Colon Branch, MD   Chief Complaint: Fall, LOC   HPI: Anna House is a 79 y.o. female who suffered a witnessed fall while using her walker at home.  She struck the front of her head and subsequently had LOC.  After EMS arrived she had 1 episode of vomiting while unconscious but seemed to guard her airway.  After arrival to the ED she remained unconscious though with purposeful movements, neurology was curb sided and felt this was likely a complicated concussion.  She finally woke up during my admission evaluation some 4 hours or so after the fall.  She isnt able to speak very well even at baseline due to a longstanding cleft palate but is now following commands and indicates she is aware she is at the hospital.  Review of Systems: Unable to perform due to patients AMS.  Past Medical History  Diagnosis Date  . Essential hypertension, benign   . Atrial fibrillation   . Hypothyroidism   . Left bundle branch block   . Cleft palate   . Cardiomyopathy     LVEF 30%, possibly ischemic - small inferoapical ischemic defect by Cardiolite 2012  . Pulmonary hypertension    Past Surgical History  Procedure Laterality Date  . Colonoscopy  01/09/2011    Procedure: COLONOSCOPY;  Surgeon: Malissa Hippo, MD;  Location: [facility];  Service: Endoscopy;  Laterality: N/A;  2:00 pm   Social History:  reports that she has never smoked. She has never used smokeless tobacco. She reports that she does not drink alcohol or use illicit drugs.  Allergies  Allergen Reactions  . Penicillins     History reviewed. No pertinent family history.   Prior to Admission medications   Medication Sig Start Date End Date Taking? Authorizing Provider  amiodarone (PACERONE) 200 MG tablet Take 0.5 tablets (100 mg total) by mouth daily. 07/02/12   Jonelle Sidle, MD  amLODipine  (NORVASC) 5 MG tablet Take 1 tablet (5 mg total) by mouth daily. 09/09/11   June Leap, MD  furosemide (LASIX) 40 MG tablet Take 40 mg by mouth 2 (two) times daily.      Historical Provider, MD  labetalol (NORMODYNE) 200 MG tablet Take 600 mg by mouth 2 (two) times daily.    Historical Provider, MD  levothyroxine (SYNTHROID, LEVOTHROID) 100 MCG tablet Take 100 mcg by mouth daily before breakfast.    Historical Provider, MD  quinapril (ACCUPRIL) 20 MG tablet Take 1 tablet by mouth Daily. 09/03/11   Historical Provider, MD  traMADol (ULTRAM) 50 MG tablet Take 1 tablet by mouth 2 (two) times daily. 04/01/12   Historical Provider, MD  Vitamin D, Ergocalciferol, (DRISDOL) 50000 UNITS CAPS Take 50,000 Units by mouth every 30 (thirty) days.     Historical Provider, MD   Physical Exam: Filed Vitals:   January 31, 2013 0500  BP: 142/77  Pulse: 69  Temp:   Resp: 20    BP 142/77  Pulse 69  Temp(Src) 98.9 F (37.2 C) (Rectal)  Resp 20  SpO2 100%  General Appearance:    Awake, indicates she knows she is at the hospital and had a fall, no distress, appears stated age  Head:    Normocephalic, trauma: bruising and swelling about the L eye  Eyes:    PERRL, EOMI, sclera non-icteric     Nose:   Nares without drainage  or epistaxis. Mucosa, turbinates normal  Throat:   Moist mucous membranes. Oropharynx without erythema or exudate.  Neck:   Supple. No carotid bruits.  No thyromegaly.  No lymphadenopathy.   Back:     No CVA tenderness, no spinal tenderness  Lungs:     Clear to auscultation bilaterally, without wheezes, rhonchi or rales  Chest wall:    No tenderness to palpitation  Heart:    Regular rate and rhythm without murmurs, gallops, rubs  Abdomen:     Soft, non-tender, nondistended, normal bowel sounds, no organomegaly  Genitalia:    deferred  Rectal:    deferred  Extremities:   No clubbing, cyanosis or edema.  Pulses:   2+ and symmetric all extremities  Skin:   Skin color, texture, turgor normal,  no rashes or lesions  Lymph nodes:   Cervical, supraclavicular, and axillary nodes normal  Neurologic:   CNII-XII intact. Normal strength, sensation and reflexes      throughout    Labs on Admission:  Basic Metabolic Panel:  Recent Labs Lab January 14, 2013 0345  NA 140  K 2.9*  CL 98  CO2 27  GLUCOSE 203*  BUN 13  CREATININE 0.93  CALCIUM 9.3   Liver Function Tests:  Recent Labs Lab 01/14/2013 0345  AST 20  ALT 8  ALKPHOS 83  BILITOT 0.3  PROT 7.5  ALBUMIN 3.6   No results found for this basename: LIPASE, AMYLASE,  in the last 168 hours No results found for this basename: AMMONIA,  in the last 168 hours CBC:  Recent Labs Lab 01-14-2013 0345  WBC 17.4*  NEUTROABS 12.7*  HGB 12.5  HCT 39.6  MCV 92.5  PLT 456*   Cardiac Enzymes: No results found for this basename: CKTOTAL, CKMB, CKMBINDEX, TROPONINI,  in the last 168 hours  BNP (last 3 results)  Recent Labs  Jan 14, 2013 0345  PROBNP 5356.0*   CBG: No results found for this basename: GLUCAP,  in the last 168 hours  Radiological Exams on Admission: Ct Head Wo Contrast  Jan 14, 2013   CLINICAL DATA:  Fall.  EXAM: CT HEAD WITHOUT CONTRAST  CT MAXILLOFACIAL WITHOUT CONTRAST  CT CERVICAL SPINE WITHOUT CONTRAST  TECHNIQUE: Multidetector CT imaging of the head, cervical spine, and maxillofacial structures were performed using the standard protocol without intravenous contrast. Multiplanar CT image reconstructions of the cervical spine and maxillofacial structures were also generated.  COMPARISON:  06/24/2012  FINDINGS: CT HEAD FINDINGS  Skull and Sinuses:No calvarial fracture. Bilateral wall down mastoidectomy.  Orbits: Bilateral cataract resection. Enlarged superior ophthalmic veins, also seen previously, which may be related to Valsalva or varix.  Brain: No evidence of acute abnormality, such as acute infarction, hemorrhage, hydrocephalus, or mass lesion/mass effect. Given differences in imaging angle, similar pattern of  small-vessel ischemic injury, with confluent deep cerebral white matter low-attenuation and remote lacunar infarcts in the bilateral thalami and left caudate nucleus. Brain atrophy with ex vacuo ventricular enlargement.  CT MAXILLOFACIAL FINDINGS  There is a nondisplaced fracture through the floor of the left orbit, with thin extra conal hemorrhage along the orbital floor, measuring up to 4 mm in thickness. The fracture traverses the infraorbital canal, without displacement No fat or muscle herniation. No proptosis. There is nondisplaced fracturing of the inferior maxillary antrum, extending from the anterior to lateral walls. Left maxillary hemosinus. No additional facial fracture seen. There is enlargement of the deep superior ophthalmic veins bilaterally, which may be from Valsalva or varix formation. Appearance is unchanged.  Right  nasal septal spur and deviation. Bilateral wall down mastoidectomy with minimal material in the bowls.  CT CERVICAL SPINE FINDINGS  No evidence of acute fracture or traumatic subluxation. No gross cervical canal hematoma or prevertebral edema.Diffuse degenerative disc disease - not causing significant canal stenosis however. There is facet osteoarthritis, most advanced at the C2-3 level.  IMPRESSION: 1. No evidence of acute intracranial injury. 2. Negative for acute cervical spine fracture. 3. Left orbital floor fracture with thin extraconal hemorrhage that does not cause proptosis. 4. Nondisplaced inferior left maxillary sinus fractures.   Electronically Signed   By: Tiburcio Pea M.D.   On: 12/21/2012 04:37   Ct Cervical Spine Wo Contrast  01/07/2013   CLINICAL DATA:  Fall.  EXAM: CT HEAD WITHOUT CONTRAST  CT MAXILLOFACIAL WITHOUT CONTRAST  CT CERVICAL SPINE WITHOUT CONTRAST  TECHNIQUE: Multidetector CT imaging of the head, cervical spine, and maxillofacial structures were performed using the standard protocol without intravenous contrast. Multiplanar CT image reconstructions  of the cervical spine and maxillofacial structures were also generated.  COMPARISON:  06/24/2012  FINDINGS: CT HEAD FINDINGS  Skull and Sinuses:No calvarial fracture. Bilateral wall down mastoidectomy.  Orbits: Bilateral cataract resection. Enlarged superior ophthalmic veins, also seen previously, which may be related to Valsalva or varix.  Brain: No evidence of acute abnormality, such as acute infarction, hemorrhage, hydrocephalus, or mass lesion/mass effect. Given differences in imaging angle, similar pattern of small-vessel ischemic injury, with confluent deep cerebral white matter low-attenuation and remote lacunar infarcts in the bilateral thalami and left caudate nucleus. Brain atrophy with ex vacuo ventricular enlargement.  CT MAXILLOFACIAL FINDINGS  There is a nondisplaced fracture through the floor of the left orbit, with thin extra conal hemorrhage along the orbital floor, measuring up to 4 mm in thickness. The fracture traverses the infraorbital canal, without displacement No fat or muscle herniation. No proptosis. There is nondisplaced fracturing of the inferior maxillary antrum, extending from the anterior to lateral walls. Left maxillary hemosinus. No additional facial fracture seen. There is enlargement of the deep superior ophthalmic veins bilaterally, which may be from Valsalva or varix formation. Appearance is unchanged.  Right nasal septal spur and deviation. Bilateral wall down mastoidectomy with minimal material in the bowls.  CT CERVICAL SPINE FINDINGS  No evidence of acute fracture or traumatic subluxation. No gross cervical canal hematoma or prevertebral edema.Diffuse degenerative disc disease - not causing significant canal stenosis however. There is facet osteoarthritis, most advanced at the C2-3 level.  IMPRESSION: 1. No evidence of acute intracranial injury. 2. Negative for acute cervical spine fracture. 3. Left orbital floor fracture with thin extraconal hemorrhage that does not cause  proptosis. 4. Nondisplaced inferior left maxillary sinus fractures.   Electronically Signed   By: Tiburcio Pea M.D.   On: 01/19/2013 04:37   Dg Pelvis Portable  01/14/2013   CLINICAL DATA:  Fall, found down.  EXAM: PORTABLE PELVIS 1-2 VIEWS  COMPARISON:  Lumbar spine radiographs June 24, 2012  FINDINGS: No acute fracture deformity or dislocation. Severe right hip osteoarthrosis with mild flattening of the femoral head. Mild to moderate left hip osteoarthrosis.  Phleboliths project in the pelvis. Patient is osteopenic without destructive bony lesions. Degenerative change of the included lumbar spine. Mild vascular calcifications.  IMPRESSION: No acute fracture deformity or dislocation.  Severe right hip osteoarthrosis with mild flattening of the head concerning for secondary AVN.   Electronically Signed   By: Awilda Metro   On: 01/19/2013 04:41   Dg Chest Portable 1  View  01/04/2013   CLINICAL DATA:  Probable fold.  EXAM: PORTABLE CHEST - 1 VIEW  COMPARISON:  12/28/2012  FINDINGS: Stable chronic cardiopericardial enlargement. Lungs are better aerated than before, although there is a retrocardiac opacity. No pneumothorax. No appreciable fracture. Cholecystectomy.  IMPRESSION: 1. Retrocardiac opacity is likely either atelectasis or aspiration given the clinical history. 2. Chronic cardiomegaly.  No edema.   Electronically Signed   By: Tiburcio Pea M.D.   On: 01/04/2013 04:39   Ct Maxillofacial Wo Cm  12/25/2012   CLINICAL DATA:  Fall.  EXAM: CT HEAD WITHOUT CONTRAST  CT MAXILLOFACIAL WITHOUT CONTRAST  CT CERVICAL SPINE WITHOUT CONTRAST  TECHNIQUE: Multidetector CT imaging of the head, cervical spine, and maxillofacial structures were performed using the standard protocol without intravenous contrast. Multiplanar CT image reconstructions of the cervical spine and maxillofacial structures were also generated.  COMPARISON:  06/24/2012  FINDINGS: CT HEAD FINDINGS  Skull and Sinuses:No calvarial  fracture. Bilateral wall down mastoidectomy.  Orbits: Bilateral cataract resection. Enlarged superior ophthalmic veins, also seen previously, which may be related to Valsalva or varix.  Brain: No evidence of acute abnormality, such as acute infarction, hemorrhage, hydrocephalus, or mass lesion/mass effect. Given differences in imaging angle, similar pattern of small-vessel ischemic injury, with confluent deep cerebral white matter low-attenuation and remote lacunar infarcts in the bilateral thalami and left caudate nucleus. Brain atrophy with ex vacuo ventricular enlargement.  CT MAXILLOFACIAL FINDINGS  There is a nondisplaced fracture through the floor of the left orbit, with thin extra conal hemorrhage along the orbital floor, measuring up to 4 mm in thickness. The fracture traverses the infraorbital canal, without displacement No fat or muscle herniation. No proptosis. There is nondisplaced fracturing of the inferior maxillary antrum, extending from the anterior to lateral walls. Left maxillary hemosinus. No additional facial fracture seen. There is enlargement of the deep superior ophthalmic veins bilaterally, which may be from Valsalva or varix formation. Appearance is unchanged.  Right nasal septal spur and deviation. Bilateral wall down mastoidectomy with minimal material in the bowls.  CT CERVICAL SPINE FINDINGS  No evidence of acute fracture or traumatic subluxation. No gross cervical canal hematoma or prevertebral edema.Diffuse degenerative disc disease - not causing significant canal stenosis however. There is facet osteoarthritis, most advanced at the C2-3 level.  IMPRESSION: 1. No evidence of acute intracranial injury. 2. Negative for acute cervical spine fracture. 3. Left orbital floor fracture with thin extraconal hemorrhage that does not cause proptosis. 4. Nondisplaced inferior left maxillary sinus fractures.   Electronically Signed   By: Tiburcio Pea M.D.   On: 01/11/2013 04:37    EKG:  Independently reviewed.  Assessment/Plan Principal Problem:   Fall at home Active Problems:   Left orbit fracture   Concussion with loss of consciousness for 1-24 hours   Aspiration pneumonitis   Concussion with 1-24 hours loss of consciousness   1. Fall at home resulting in: 1. Concussion with LOC for 4 hours - patient now waking up and slowly improving mental status, admit to SDU for further monitoring, no evidence of ICH on CT head.  Keeping her NPO and bed rest for now, advance this as her mental status improves. 2. Left orbit fracture - likely will wish to consult optho routinely as inpatient, CT scan at present indicates no proptosis or displacement of fracture. 2. Aspiration pneumonitis - likely from the emesis episode at home, patient sating 100% on 2L via , observe closely as she is at risk for development  of Aspiration PNA in the next 48-72 hours but as I explained to family with no evidence of infection (feel her leukocytosis is due to stress from trauma), would not want to start her on ABx yet.  Start ABx if she develops fever, worsening pulmonary status, or other signs of infection.  Given trauma: delay start of pharmacologic DVT prophylaxis opting to use SCDs.  Code Status: Full  Family Communication: Family at bedside Disposition Plan: Admit to SDU   Time spent: 70 min  GARDNER, JARED M. [facility] Pager [phone]  If 7AM-7PM, please contact the day team taking care of the patient [email or web address] Password [email or web address] 01/04/2013, 6:06 AM

## 2013-01-03 NOTE — Plan of Care (Signed)
Chart reviewed-pt examined. She is restless and moaning. With stimulation she opens eyes and briefly follows commands. No focal neuro deficits appreciated. Left eye swollen almost shut (acute orbital floor fx after fall). PERL.  In AF occ rates up to > 100- K 2.7 at admit- will replete K IV and check lytes at 8 pm. TSH normal- cont home Synthroid equivalent. Will also schedule IV Lopressor and allow prn Labetelol for HTN- may require IV Cardizem for rate control. Will allow PR Tylenol for pain- no NSAIDs due to low GFR. Last ECHO 2013 showed EF 20-25% so will slow fluids to 50/hr- pt mouth very dry but should avoid rapid rehydration since not hypotensive. Due to AMS an ABG was checked and showed mild elevated PCo2 (47) with normal pH and PO2- bicarb low but suspect due to [facility]. CT Pelvis in ER concerning for AVN right hip (could have precipitated fall). Will also obtain UA and cx. Pt is FULL CODE.  Junious Silk, ANP  The patient was seen for followup visit.  History was reviewed.  Patient was examined.  I have personally examined this patient and reviewed the entire database. I have reviewed the above note, made any necessary editorial changes, and agree with its content.  Lonia Blood, MD [facility]

## 2013-01-03 NOTE — ED Notes (Signed)
Admitting MD at bedside.

## 2013-01-03 NOTE — ED Provider Notes (Signed)
CSN: [id number]     Arrival date & time 12/22/2012  4098 History   None    Chief Complaint  Patient presents with  . Fall   (Consider location/radiation/quality/duration/timing/severity/associated sxs/prior Treatment) HPI Comments: 79 yo wf presents via EMS s/p fall.  Pt reportedly fell while walking with walker.  EMS was called at 0220.  Hx obtained from EMS only due to pt's condition.  EMS found pt down at house.  She had vomited.  She was minimally responsive at scene, but protecting her airway.  No interventions by EMS.  BG in 130s per EMS.    Pt has pmh of Afib, HTN, hypothyroidism, LBBB, Pulm HTN, cardiomyopathy.    Patient is a 79 y.o. female presenting with fall. The history is provided by the EMS personnel. The history is limited by the condition of the patient.  Fall This is a new problem. Episode onset: 0220. The problem occurs constantly. The problem has not changed since onset.She has tried nothing for the symptoms.    Past Medical History  Diagnosis Date  . Essential hypertension, benign   . Atrial fibrillation   . Hypothyroidism   . Left bundle branch block   . Cleft palate   . Cardiomyopathy     LVEF 30%, possibly ischemic - small inferoapical ischemic defect by Cardiolite 2012  . Pulmonary hypertension    Past Surgical History  Procedure Laterality Date  . Colonoscopy  01/09/2011    Procedure: COLONOSCOPY;  Surgeon: Malissa Hippo, MD;  Location: [facility];  Service: Endoscopy;  Laterality: N/A;  2:00 pm   History reviewed. No pertinent family history. History  Substance Use Topics  . Smoking status: Never Smoker   . Smokeless tobacco: Never Used  . Alcohol Use: No   OB History   Grav Para Term Preterm Abortions TAB SAB Ect Mult Living                 Review of Systems  Unable to perform ROS: Acuity of condition    Allergies  Penicillins  Home Medications   Current Outpatient Rx  Name  Route  Sig  Dispense  Refill  . amiodarone (PACERONE)  200 MG tablet   Oral   Take 0.5 tablets (100 mg total) by mouth daily.   15 tablet   6     Dose decrease   . amLODipine (NORVASC) 5 MG tablet   Oral   Take 1 tablet (5 mg total) by mouth daily.   30 tablet   6     Dose increased 09/09/2011.   . furosemide (LASIX) 40 MG tablet   Oral   Take 40 mg by mouth 2 (two) times daily.           Marland Kitchen labetalol (NORMODYNE) 200 MG tablet   Oral   Take 600 mg by mouth 2 (two) times daily.         Marland Kitchen levothyroxine (SYNTHROID, LEVOTHROID) 100 MCG tablet   Oral   Take 100 mcg by mouth daily before breakfast.         . quinapril (ACCUPRIL) 20 MG tablet   Oral   Take 1 tablet by mouth Daily.         . traMADol (ULTRAM) 50 MG tablet   Oral   Take 1 tablet by mouth 2 (two) times daily.         . Vitamin D, Ergocalciferol, (DRISDOL) 50000 UNITS CAPS   Oral   Take 50,000 Units by mouth  every 30 (thirty) days.           BP 170/67  Pulse 87  Resp 17  SpO2 99% Physical Exam  Nursing note and vitals reviewed. Constitutional: She appears listless. She appears distressed.  HENT:  Head: Not macrocephalic and not microcephalic. Head is with contusion. Head is without raccoon's eyes, without Battle's sign, without abrasion and without laceration. Hair is normal.    Emesis noted.  Gag reflex intact.  Eyes: Conjunctivae are normal. Pupils are equal, round, and reactive to light.  Neck: Normal range of motion. Neck supple.  Cardiovascular: Normal rate.  Exam reveals no friction rub.   No murmur heard. Pulmonary/Chest: Effort normal and breath sounds normal. She has no wheezes. She has no rales.  Abdominal: Soft. Bowel sounds are normal. She exhibits no distension. There is no tenderness. There is no rebound.  Musculoskeletal: Normal range of motion. She exhibits no edema and no tenderness.  Neurological: She has normal strength and normal reflexes. She appears listless.  Moving all extremities, responds to painful stimuli  Skin:  Skin is warm and dry. She is not diaphoretic.    ED Course  Procedures (including critical care time)  CRITICAL CARE Performed by: Redgie Grayer, November Sypher   Total critical care time:45 minutes  Critical care time was exclusive of separately billable procedures and treating other patients.  Critical care was necessary to treat or prevent imminent or life-threatening deterioration.  Critical care was time spent personally by me on the following activities: development of treatment plan with patient and/or surrogate as well as nursing, discussions with consultants, evaluation of patient's response to treatment, examination of patient, obtaining history from patient or surrogate, ordering and performing treatments and interventions, ordering and review of laboratory studies, ordering and review of radiographic studies, pulse oximetry and re-evaluation of patient's condition.    Imaging Review Ct Head Wo Contrast  01/08/2013   CLINICAL DATA:  Fall.  EXAM: CT HEAD WITHOUT CONTRAST  CT MAXILLOFACIAL WITHOUT CONTRAST  CT CERVICAL SPINE WITHOUT CONTRAST  TECHNIQUE: Multidetector CT imaging of the head, cervical spine, and maxillofacial structures were performed using the standard protocol without intravenous contrast. Multiplanar CT image reconstructions of the cervical spine and maxillofacial structures were also generated.  COMPARISON:  06/24/2012  FINDINGS: CT HEAD FINDINGS  Skull and Sinuses:No calvarial fracture. Bilateral wall down mastoidectomy.  Orbits: Bilateral cataract resection. Enlarged superior ophthalmic veins, also seen previously, which may be related to Valsalva or varix.  Brain: No evidence of acute abnormality, such as acute infarction, hemorrhage, hydrocephalus, or mass lesion/mass effect. Given differences in imaging angle, similar pattern of small-vessel ischemic injury, with confluent deep cerebral white matter low-attenuation and remote lacunar infarcts in the bilateral thalami and left  caudate nucleus. Brain atrophy with ex vacuo ventricular enlargement.  CT MAXILLOFACIAL FINDINGS  There is a nondisplaced fracture through the floor of the left orbit, with thin extra conal hemorrhage along the orbital floor, measuring up to 4 mm in thickness. The fracture traverses the infraorbital canal, without displacement No fat or muscle herniation. No proptosis. There is nondisplaced fracturing of the inferior maxillary antrum, extending from the anterior to lateral walls. Left maxillary hemosinus. No additional facial fracture seen. There is enlargement of the deep superior ophthalmic veins bilaterally, which may be from Valsalva or varix formation. Appearance is unchanged.  Right nasal septal spur and deviation. Bilateral wall down mastoidectomy with minimal material in the bowls.  CT CERVICAL SPINE FINDINGS  No evidence of acute fracture or traumatic  subluxation. No gross cervical canal hematoma or prevertebral edema.Diffuse degenerative disc disease - not causing significant canal stenosis however. There is facet osteoarthritis, most advanced at the C2-3 level.  IMPRESSION: 1. No evidence of acute intracranial injury. 2. Negative for acute cervical spine fracture. 3. Left orbital floor fracture with thin extraconal hemorrhage that does not cause proptosis. 4. Nondisplaced inferior left maxillary sinus fractures.   Electronically Signed   By: Tiburcio Pea M.D.   On: 01/10/2013 04:37   Ct Cervical Spine Wo Contrast  12/27/2012   CLINICAL DATA:  Fall.  EXAM: CT HEAD WITHOUT CONTRAST  CT MAXILLOFACIAL WITHOUT CONTRAST  CT CERVICAL SPINE WITHOUT CONTRAST  TECHNIQUE: Multidetector CT imaging of the head, cervical spine, and maxillofacial structures were performed using the standard protocol without intravenous contrast. Multiplanar CT image reconstructions of the cervical spine and maxillofacial structures were also generated.  COMPARISON:  06/24/2012  FINDINGS: CT HEAD FINDINGS  Skull and Sinuses:No  calvarial fracture. Bilateral wall down mastoidectomy.  Orbits: Bilateral cataract resection. Enlarged superior ophthalmic veins, also seen previously, which may be related to Valsalva or varix.  Brain: No evidence of acute abnormality, such as acute infarction, hemorrhage, hydrocephalus, or mass lesion/mass effect. Given differences in imaging angle, similar pattern of small-vessel ischemic injury, with confluent deep cerebral white matter low-attenuation and remote lacunar infarcts in the bilateral thalami and left caudate nucleus. Brain atrophy with ex vacuo ventricular enlargement.  CT MAXILLOFACIAL FINDINGS  There is a nondisplaced fracture through the floor of the left orbit, with thin extra conal hemorrhage along the orbital floor, measuring up to 4 mm in thickness. The fracture traverses the infraorbital canal, without displacement No fat or muscle herniation. No proptosis. There is nondisplaced fracturing of the inferior maxillary antrum, extending from the anterior to lateral walls. Left maxillary hemosinus. No additional facial fracture seen. There is enlargement of the deep superior ophthalmic veins bilaterally, which may be from Valsalva or varix formation. Appearance is unchanged.  Right nasal septal spur and deviation. Bilateral wall down mastoidectomy with minimal material in the bowls.  CT CERVICAL SPINE FINDINGS  No evidence of acute fracture or traumatic subluxation. No gross cervical canal hematoma or prevertebral edema.Diffuse degenerative disc disease - not causing significant canal stenosis however. There is facet osteoarthritis, most advanced at the C2-3 level.  IMPRESSION: 1. No evidence of acute intracranial injury. 2. Negative for acute cervical spine fracture. 3. Left orbital floor fracture with thin extraconal hemorrhage that does not cause proptosis. 4. Nondisplaced inferior left maxillary sinus fractures.   Electronically Signed   By: Tiburcio Pea M.D.   On: 12/27/2012 04:37   Dg  Chest Portable 1 View  12/20/2012   CLINICAL DATA:  Probable fold.  EXAM: PORTABLE CHEST - 1 VIEW  COMPARISON:  12/28/2012  FINDINGS: Stable chronic cardiopericardial enlargement. Lungs are better aerated than before, although there is a retrocardiac opacity. No pneumothorax. No appreciable fracture. Cholecystectomy.  IMPRESSION: 1. Retrocardiac opacity is likely either atelectasis or aspiration given the clinical history. 2. Chronic cardiomegaly.  No edema.   Electronically Signed   By: Tiburcio Pea M.D.   On: 01/18/2013 04:39   Ct Maxillofacial Wo Cm  01/02/2013   CLINICAL DATA:  Fall.  EXAM: CT HEAD WITHOUT CONTRAST  CT MAXILLOFACIAL WITHOUT CONTRAST  CT CERVICAL SPINE WITHOUT CONTRAST  TECHNIQUE: Multidetector CT imaging of the head, cervical spine, and maxillofacial structures were performed using the standard protocol without intravenous contrast. Multiplanar CT image reconstructions of the cervical spine and  maxillofacial structures were also generated.  COMPARISON:  06/24/2012  FINDINGS: CT HEAD FINDINGS  Skull and Sinuses:No calvarial fracture. Bilateral wall down mastoidectomy.  Orbits: Bilateral cataract resection. Enlarged superior ophthalmic veins, also seen previously, which may be related to Valsalva or varix.  Brain: No evidence of acute abnormality, such as acute infarction, hemorrhage, hydrocephalus, or mass lesion/mass effect. Given differences in imaging angle, similar pattern of small-vessel ischemic injury, with confluent deep cerebral white matter low-attenuation and remote lacunar infarcts in the bilateral thalami and left caudate nucleus. Brain atrophy with ex vacuo ventricular enlargement.  CT MAXILLOFACIAL FINDINGS  There is a nondisplaced fracture through the floor of the left orbit, with thin extra conal hemorrhage along the orbital floor, measuring up to 4 mm in thickness. The fracture traverses the infraorbital canal, without displacement No fat or muscle herniation. No  proptosis. There is nondisplaced fracturing of the inferior maxillary antrum, extending from the anterior to lateral walls. Left maxillary hemosinus. No additional facial fracture seen. There is enlargement of the deep superior ophthalmic veins bilaterally, which may be from Valsalva or varix formation. Appearance is unchanged.  Right nasal septal spur and deviation. Bilateral wall down mastoidectomy with minimal material in the bowls.  CT CERVICAL SPINE FINDINGS  No evidence of acute fracture or traumatic subluxation. No gross cervical canal hematoma or prevertebral edema.Diffuse degenerative disc disease - not causing significant canal stenosis however. There is facet osteoarthritis, most advanced at the C2-3 level.  IMPRESSION: 1. No evidence of acute intracranial injury. 2. Negative for acute cervical spine fracture. 3. Left orbital floor fracture with thin extraconal hemorrhage that does not cause proptosis. 4. Nondisplaced inferior left maxillary sinus fractures.   Electronically Signed   By: Tiburcio Pea M.D.   On: 02/02/13 04:37    EKG Interpretation    Date/Time:  Monday 02-02-2013 03:44:14 EST Ventricular Rate:  82 PR Interval:    QRS Duration: 155 QT Interval:  404 QTC Calculation: 472 R Axis:   -148 Text Interpretation:  Atrial fibrillation Ventricular premature complex Nonspecific intraventricular conduction delay Anterior infarct, acute (LAD) Lateral leads are also involved Confirmed by [facility]  MD, Storey Stangeland ([id number]) on 02/02/13 3:51:24 AM           CXR: Retrocardiac opacity, atelectasis vs aspiration Pelvis Xray: no fracture or dislocation, severe arthritis to R hip  Results for orders placed during the hospital encounter of 02/02/13  CBC WITH DIFFERENTIAL      Result Value Range   WBC 17.4 (*) 4.0 - 10.5 K/uL   RBC 4.28  3.87 - 5.11 MIL/uL   Hemoglobin 12.5  12.0 - 15.0 g/dL   HCT 16.1  09.6 - 04.5 %   MCV 92.5  78.0 - 100.0 fL   MCH 29.2  26.0 - 34.0 pg    MCHC 31.6  30.0 - 36.0 g/dL   RDW 40.9  81.1 - 91.4 %   Platelets 456 (*) 150 - 400 K/uL   Neutrophils Relative % 73  43 - 77 %   Neutro Abs 12.7 (*) 1.7 - 7.7 K/uL   Lymphocytes Relative 18  12 - 46 %   Lymphs Abs 3.2  0.7 - 4.0 K/uL   Monocytes Relative 6  3 - 12 %   Monocytes Absolute 1.0  0.1 - 1.0 K/uL   Eosinophils Relative 2  0 - 5 %   Eosinophils Absolute 0.4  0.0 - 0.7 K/uL   Basophils Relative 0  0 - 1 %  Basophils Absolute 0.1  0.0 - 0.1 K/uL  COMPREHENSIVE METABOLIC PANEL      Result Value Range   Sodium 140  135 - 145 mEq/L   Potassium 2.9 (*) 3.5 - 5.1 mEq/L   Chloride 98  96 - 112 mEq/L   CO2 27  19 - 32 mEq/L   Glucose, Bld 203 (*) 70 - 99 mg/dL   BUN 13  6 - 23 mg/dL   Creatinine, Ser 1.61  0.50 - 1.10 mg/dL   Calcium 9.3  8.4 - 09.6 mg/dL   Total Protein 7.5  6.0 - 8.3 g/dL   Albumin 3.6  3.5 - 5.2 g/dL   AST 20  0 - 37 U/L   ALT 8  0 - 35 U/L   Alkaline Phosphatase 83  39 - 117 U/L   Total Bilirubin 0.3  0.3 - 1.2 mg/dL   GFR calc non Af Amer 58 (*) >90 mL/min   GFR calc Af Amer 67 (*) >90 mL/min  PROTIME-INR      Result Value Range   Prothrombin Time 12.9  11.6 - 15.2 seconds   INR 0.99  0.00 - 1.49  APTT      Result Value Range   aPTT 30  24 - 37 seconds  DIGOXIN LEVEL      Result Value Range   Digoxin Level 1.1  0.8 - 2.0 ng/mL  PRO B NATRIURETIC PEPTIDE      Result Value Range   Pro B Natriuretic peptide (BNP) 5356.0 (*) 0 - 450 pg/mL  POCT I-STAT TROPONIN I      Result Value Range   Troponin i, poc 0.01  0.00 - 0.08 ng/mL   Comment 3           CG4 I-STAT (LACTIC ACID)      Result Value Range   Lactic Acid, Venous 2.20  0.5 - 2.2 mmol/L  SAMPLE TO BLOOD BANK      Result Value Range   Blood Bank Specimen SAMPLE AVAILABLE FOR TESTING     Sample Expiration 01/04/2013       MDM   1. Fall (on)(from) incline, initial encounter   2. Orbit fracture, left, closed, initial encounter   3. Maxillary fracture, closed, initial encounter   4.  Emesis   5. Abnormal CXR - concerning for aspiration 6. Leukocytosis 7. Elevated BMP 8. Abnormal EKG  79 yo wf found down at home.  Suspected fall based on positioning and trauma to face.  On arrival primary survey intact. A: patent and protected, pt did vomit at home and in ambulance, but no pooling of secretions B: CTAB, resp 16, POx 100% on 2L Plain C: strong radial pulses, BP elevated D: responds to pain, MAE, pupils equal and react to light E: exposed, no signs of trauma or TTP (other than face)  Safety net in place.  STAT CT head, cspine, CXR ordered.  Bloodwork - CBC, CMP, coags, dig, TSH, trop, and bnp ordered.    STAT EKG with concern for Anterior STEMI.  At 0410 I called interventional cards to discuss case.  He will review EKG.  0423:  Spoke with Dr. Kendall Flack - no plan for emergent cath.  He reviewed pt's [facility] notes and pt has been managed conservatively.  Consult cards if needed.    Labetalol 20mg  IV for hypertension.    4:43 AM Consult neurology for input.  Spoke with Dr. Amada Jupiter who suspects concussion.  Recommended MRI in AM.  I  spoke with pt's family (daughter and two sisters).  Fall was witnessed. No seizure activity.  Pt was recently admitted to [facility] for "lung problem" and has a h/o heart dz.    Plan for consult to medicine for admission.    6:19 AM Pt evaluated by hospitalist Dr. Julian Reil.  Plan for admission.  Zofran given to prophylax for vomiting.  Patient's mental status has improved significantly. During Dr. [place] Service evaluation she awoke. She continues to have purposeful movements. Her airway is patent and protected.  Her pulse ox is 100% on 2 L nasal cannula. Family updated and aware of the plan.  At approximately 7:00 AM patient was turned over to oncoming provider pending admission to step down unit. Patient will likely benefit from MRI in the a.m. She patient does have an elevated white blood cell count and findings on chest x-ray  consistent with possible pneumonitis aspiration.  She is afebrile and does not appear septic. D/w Dr. Julian Reil.  Joint decision to hold antibiotics for now.    Darlys Gales, MD 01/10/2013 [phone]

## 2013-01-04 ENCOUNTER — Encounter (HOSPITAL_COMMUNITY): Payer: Self-pay | Admitting: Nurse Practitioner

## 2013-01-04 DIAGNOSIS — E876 Hypokalemia: Secondary | ICD-10-CM | POA: Diagnosis present

## 2013-01-04 DIAGNOSIS — I4460 Unspecified fascicular block: Secondary | ICD-10-CM

## 2013-01-04 DIAGNOSIS — I428 Other cardiomyopathies: Secondary | ICD-10-CM

## 2013-01-04 DIAGNOSIS — M87059 Idiopathic aseptic necrosis of unspecified femur: Secondary | ICD-10-CM | POA: Diagnosis present

## 2013-01-04 DIAGNOSIS — J9601 Acute respiratory failure with hypoxia: Secondary | ICD-10-CM | POA: Diagnosis present

## 2013-01-04 DIAGNOSIS — I4891 Unspecified atrial fibrillation: Secondary | ICD-10-CM

## 2013-01-04 LAB — CBC
MCH: 28.8 pg (ref 26.0–34.0)
MCHC: 30.5 g/dL (ref 30.0–36.0)
Platelets: 296 10*3/uL (ref 150–400)
RBC: 3.93 MIL/uL (ref 3.87–5.11)
RDW: 14.3 % (ref 11.5–15.5)

## 2013-01-04 LAB — COMPREHENSIVE METABOLIC PANEL
ALT: 6 U/L (ref 0–35)
AST: 15 U/L (ref 0–37)
CO2: 29 mEq/L (ref 19–32)
Chloride: 104 mEq/L (ref 96–112)
Creatinine, Ser: 0.66 mg/dL (ref 0.50–1.10)
GFR calc Af Amer: >90 mL/min (ref >90)
GFR calc non Af Amer: 83 mL/min — ABNORMAL LOW (ref >90)
Glucose, Bld: 84 mg/dL (ref 70–99)
Sodium: 142 mEq/L (ref 135–145)
Total Bilirubin: 0.4 mg/dL (ref 0.3–1.2)

## 2013-01-04 LAB — URINE CULTURE
Colony Count: NO GROWTH
Culture: NO GROWTH

## 2013-01-04 MED ORDER — ONDANSETRON HCL 4 MG/2ML IJ SOLN
INTRAMUSCULAR | Status: AC
  Filled 2013-01-04: qty 2

## 2013-01-04 MED ORDER — ONDANSETRON HCL 4 MG/2ML IJ SOLN
4.0000 mg | Freq: Four times a day (QID) | INTRAMUSCULAR | Status: DC | PRN
  Administered 2013-01-04: 4 mg via INTRAVENOUS

## 2013-01-04 NOTE — Evaluation (Addendum)
Physical Therapy Evaluation Patient Details Name: Anna House MRN: [id number] DOB: 11-Jan-1934 Today's Date: 01/04/2013 Time: 4098-1191 PT Time Calculation (min): 24 min  PT Assessment / Plan / Recommendation History of Present Illness  Pt admit after fall at home with concussion and AMS as well as left orbital fracture.    Clinical Impression  Pt admitted with above. Pt currently with functional limitations due to the deficits listed below (see PT Problem List). Evaluation limited by pts inability to maintain arousal.  Pt will benefit from skilled PT to increase their independence and safety with mobility to allow discharge to the venue listed below.     PT Assessment  Patient needs continued PT services    Follow Up Recommendations  SNF;Supervision/Assistance - 24 hour                Equipment Recommendations  Other (comment) (TBA)         Frequency Min 3X/week    Precautions / Restrictions Precautions Precautions: Fall Restrictions Weight Bearing Restrictions: No   Pertinent Vitals/Pain VSS, no pain      Mobility  Bed Mobility Bed Mobility: Rolling Right;Right Sidelying to Sit Rolling Right: 1: +1 Total assist Right Sidelying to Sit: 1: +1 Total assist;HOB elevated Details for Bed Mobility Assistance: Pt did show some initiation of movement when trying to get pt to EOB, however quickly pt quit assisting with decr postural stability needing incr assist.   Transfers Transfers: Not assessed Ambulation/Gait Ambulation/Gait Assistance: Not tested (comment) Stairs: No Wheelchair Mobility Wheelchair Mobility: No         PT Diagnosis: Generalized weakness  PT Problem List: Decreased activity tolerance;Decreased balance;Decreased mobility;Decreased knowledge of use of DME;Decreased safety awareness PT Treatment Interventions: DME instruction;Gait training;Functional mobility training;Therapeutic activities;Therapeutic exercise;Balance training;Patient/family  education     PT Goals(Current goals can be found in the care plan section) Acute Rehab PT Goals Patient Stated Goal: unable to assess PT Goal Formulation: With patient Time For Goal Achievement: 01/11/13 Potential to Achieve Goals: Fair  Visit Information  Last PT Received On: 01/04/13 Assistance Needed: +2 History of Present Illness: Pt admit after fall at home with concussion and AMS as well as left orbital fracture.         Prior Functioning  Home Living Family/patient expects to be discharged to:: Skilled nursing facility Additional Comments: Unable to assess as pt lethargic and no family present. Prior Function Comments: unsure as pt lethargic and no family present Communication Communication: Other (comment) (did not speak.  Mumbled under her breath when PT moved pt)    Cognition  Cognition Arousal/Alertness: Lethargic Behavior During Therapy: Flat affect Overall Cognitive Status: Difficult to assess Difficult to assess due to: Level of arousal    Extremity/Trunk Assessment Upper Extremity Assessment Upper Extremity Assessment: Defer to OT evaluation Lower Extremity Assessment Lower Extremity Assessment: Difficult to assess due to impaired cognition Cervical / Trunk Assessment Cervical / Trunk Assessment: Kyphotic   Balance Balance Balance Assessed: Yes Static Sitting Balance Static Sitting - Balance Support: Bilateral upper extremity supported;Feet supported Static Sitting - Level of Assistance: 1: +1 Total assist Static Sitting - Comment/# of Minutes: Sat 5 min with varying total assist most of time with forward head and trunk flexed.  Pt did demonstrate some postural stability for less than 5 seconds with max cues and then would lose all  postural stability and need assist.    End of Session PT - End of Session Equipment Utilized During Treatment: Gait belt;Oxygen Activity Tolerance:  Patient limited by fatigue Patient left: in bed;with call bell/phone within  reach Nurse Communication: Mobility status       INGOLD,Rosmarie Esquibel 01/04/2013, 3:40 PM [facility] Acute Rehabilitation [phone] (pager)

## 2013-01-04 NOTE — Progress Notes (Signed)
SLP Cancellation Note  Patient Details Name: Anna House MRN: [id number] DOB: December 17, 1933   Cancelled swallowing evaluation:       Reason Eval/Treat Not Completed: Fatigue/lethargy limiting ability to participate.  Will attempt again this afternoon if schedule permits.   [name] 01/04/2013, 11:22 AM [name]. [name], [place] CCC/SLP Pager [phone]

## 2013-01-04 NOTE — Progress Notes (Signed)
INITIAL NUTRITION ASSESSMENT  DOCUMENTATION CODES Per approved criteria  -Not Applicable   INTERVENTION: Patient is at risk for malnutrition related to acute injury.    If unable to advance diet, consider TF.  Jevity 1.2 "goal" of 50 ml/hr would meet estimated needs and provide 1440 kcal, 67 gm protein, 972 ml free water.   Advance diet per MD when medically ready.    Add supplements as appropriate after diet advancement.   NUTRITION DIAGNOSIS: Inadequate oral intake related to inability to eat as evidenced by npo status.   Goal: Tolerate diet advancement with intake to meet >/=75% of estimated needs.  Monitor:  Diet advancement, intake, tolerance, labs, weight trend  Reason for Assessment: Low Braden  79 y.o. female  Admitting Dx: Fall at home  ASSESSMENT: Patient lives with sister and had a witnessed fall using her walker.  Sustained concussion.  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  Patient is currently sleeping and is unsafe for po until more regularly alert.  Patient has a long standing cleft palate.  Unable to reach family to obtain hx.  Patient has had an 8% weight loss in the past 8 months.    Nutrition Focused Physical Exam:  Subcutaneous Fat:  Orbital Region: wnl Upper Arm Region: wnl Thoracic and Lumbar Region: n/a  Muscle:  Temple Region: mild Clavicle Bone Region: wnl Clavicle and Acromion Bone Region: wnl Scapular Bone Region: n/a Dorsal Hand: n/a Patellar Region: wnl Anterior Thigh Region: n/a Posterior Calf Region: n/a  Edema: not noted    Height: Ht Readings from Last 1 Encounters:  01/01/2013 5\' 3"  (1.6 m)    Weight: Wt Readings from Last 1 Encounters:  12/22/2012 119 lb 14.9 oz (54.4 kg)    Ideal Body Weight: 115 lbs  % Ideal Body Weight: 104  Wt Readings from Last 10 Encounters:  01/13/2013 119 lb 14.9 oz (54.4 kg)  07/02/12 125 lb (56.7 kg)  05/13/12 130 lb (58.968 kg)  09/09/11 128 lb (58.06 kg)  12/02/10 132 lb 6.4 oz (60.056 kg)     Usual Body Weight: 130 lbs approximately 8 months ago  % Usual Body Weight: 92  BMI:  Body mass index is 21.25 kg/(m^2).  Estimated Nutritional Needs: Kcal: 1400-1600 Protein: 65-75 gm Fluid: >1.4L daily  Skin: no rashes or lesions  Diet Order: NPO  EDUCATION NEEDS: -No education needs identified at this time   Intake/Output Summary (Last 24 hours) at 01/04/13 1046 Last data filed at 01/04/13 1000  Gross per 24 hour  Intake    800 ml  Output   1000 ml  Net   -200 ml    Last BM:  unknown  Labs:   Recent Labs Lab 01/17/2013 0345 01/06/2013 1800 01/04/13 0520  NA 140 142 142  K 2.9* 4.4 4.1  CL 98 102 104  CO2 27 28 29   BUN 13 8 8   CREATININE 0.93 0.62 0.66  CALCIUM 9.3 8.9 9.0  GLUCOSE 203* 96 84    CBG (last 3)  No results found for this basename: GLUCAP,  in the last 72 hours  Scheduled Meds: . levothyroxine  50 mcg Intravenous QAC breakfast  . metoprolol  5 mg Intravenous Q6H  . sodium chloride  3 mL Intravenous Q12H    Continuous Infusions: . sodium chloride 50 mL/hr at 01/04/13 1610    Past Medical History  Diagnosis Date  . Essential hypertension, benign   . Atrial fibrillation   . Hypothyroidism   . Left bundle branch block   .  Cleft palate   . Cardiomyopathy     LVEF 30%, possibly ischemic - small inferoapical ischemic defect by Cardiolite 2012  . Pulmonary hypertension     Past Surgical History  Procedure Laterality Date  . Colonoscopy  01/09/2011    Procedure: COLONOSCOPY;  Surgeon: Malissa Hippo, MD;  Location: [facility];  Service: Endoscopy;  Laterality: N/A;  2:00 pm    Oran Rein, RD, LDN Clinical Inpatient Dietitian Pager:  [phone] Weekend and after hours pager:  [phone]

## 2013-01-04 NOTE — Consult Note (Signed)
CARDIOLOGY CONSULT NOTE   Patient ID: Anna House MRN: [id number], DOB/AGE: [birth year]   Admit date: 12/28/2012 Date of Consult: 01/04/2013   Primary Physician: Colon Branch, MD Primary Cardiologist: Ival Bible, MD   Pt. Profile  79 y/o female with h/o PAF on amiodarone @ home, along w/ a h/o falls, preventing oral anticoagulation, that presented to the ED yesterday following a fall and concussion.  We've been asked to assist with mgmt of afib.  Problem List  Past Medical History  Diagnosis Date  . Essential hypertension, benign   . PAF (paroxysmal atrial fibrillation)     a. on amiodarone (dose reduced to 100mg  daily 06/2012);  b. Not felt to be anticoagulation candidate.  . Hypothyroidism   . Left bundle branch block   . Cleft palate   . Cardiomyopathy     a. 2012 Cardiolite: small inferoapical ischemic defect-> conservative mgmt;  b. 08/2011 Echo: EF 20-25%, multiple wma's, diastolic dysfxn, nl valves, nl LA size.  . Pulmonary hypertension   . Frequent falls   . Concussion     a. 12/2012  . Left Orbital Fracture     a. 12/2012    Past Surgical History  Procedure Laterality Date  . Colonoscopy  01/09/2011    Procedure: COLONOSCOPY;  Surgeon: Malissa Hippo, MD;  Location: [facility];  Service: Endoscopy;  Laterality: N/A;  2:00 pm    Allergies  Allergies  Allergen Reactions  . Penicillins    HPI   79 y/o female with a h/o PAF on oral amiodarone therapy @ home.  She is not on anticoagulation 2/2 a h/o frequent falls.  She also has a h/o cardiomyopathy, presumably ischemic, with an EF of 20-25% by echo in 08/2011.  She has been managed conservatively as an outpt with bb/acei/diuretic therapy.  She was last seen in clinic in 06/2012, at which time her amiodarone dose was reduced to 100mg  daily (from 200mg  daily).  Though I cannot find an ECG from that visit, her exam shows a rate of 60 with a regular rate and rhythm.  It's notable that she was in rate  controlled afib in 11/2011 (ECG's in [place]).    She was admitted yesterday following a fall in which she apparently struck the front of her head.  She was unresponsive upon EMS arrival and vomited x 1.  In the ED, she remained unconscious with purposeful mvmts.  Neurology was curb-sided and CT of the brain was performed.  Left orbital fx was noted but otw, there were no acute intracranial abnormalities.  She has been admitted to internal medicine and has been in rate-controlled afib since admission.  ECG also shows LBBB, which is chronic.  We've been asked to eval.  She is currently unresponsive and per nsg, has been all afternoon.  Inpatient Medications  . levothyroxine  50 mcg Intravenous QAC breakfast  . metoprolol  5 mg Intravenous Q6H  . sodium chloride  3 mL Intravenous Q12H    Family History - unable to obtain 2/2 unresponsiveness.  Social History - unable to obtain 2/2 unresponsiveness.  Records per epic. History   Social History  . Marital Status: Single    Spouse Name: N/A    Number of Children: N/A  . Years of Education: N/A   Occupational History  . Not on file.   Social History Main Topics  . Smoking status: Never Smoker   . Smokeless tobacco: Never Used  . Alcohol Use: No  . Drug  Use: No  . Sexual Activity: Not on file   Other Topics Concern  . Not on file   Social History Narrative  . No narrative on file     Review of Systems - unable to obtain 2/2 unresponsiveness.  Physical Exam  Blood pressure 139/66, pulse 82, temperature 98.9 F (37.2 C), temperature source Axillary, resp. rate 19, height 5\' 3"  (1.6 m), weight 119 lb 14.9 oz (54.4 kg), SpO2 100.00%.  General: unresponsive. Psych: unresponsive. Neuro: unresponsive. HEENT: Notable for left orbit ecchymosis.  Neck: Supple without bruits or JVD. Lungs:  Resp regular and unlabored, CTA. Heart: RRR no s3, s4, or murmurs. Abdomen: Soft, non-tender, non-distended, BS + x 4.  Extremities: No clubbing,  cyanosis or edema. DP/PT/Radials 2+ and equal bilaterally.  Labs   Recent Labs  01/02/2013 1447  CKTOTAL 50   Lab Results  Component Value Date   WBC 9.7 01/04/2013   HGB 11.3* 01/04/2013   HCT 37.0 01/04/2013   MCV 94.1 01/04/2013   PLT 296 01/04/2013     Recent Labs Lab 01/04/13 0520  NA 142  K 4.1  CL 104  CO2 29  BUN 8  CREATININE 0.66  CALCIUM 9.0  PROT 6.7  BILITOT 0.4  ALKPHOS 76  ALT 6  AST 15  GLUCOSE 84   Radiology/Studies  Ct Head/Cervical Spine Wo Contrast  01/07/2013   CLINICAL DATA:  Fall.  EXAM: CT HEAD WITHOUT CONTRAST  CT MAXILLOFACIAL WITHOUT CONTRAST  CT CERVICAL SPINE WITHOUT CONTRAST  TECHNIQUE: Multidetector CT imaging of the head, cervical spine, and maxillofacial structures were performed using the standard protocol without intravenous contrast. Multiplanar CT image reconstructions of the cervical spine and maxillofacial structures were also generated.  COMPARISON:  06/24/2012  FINDINGS: CT HEAD FINDINGS  Skull and Sinuses:No calvarial fracture. Bilateral wall down mastoidectomy.  Orbits: Bilateral cataract resection. Enlarged superior ophthalmic veins, also seen previously, which may be related to Valsalva or varix.  Brain: No evidence of acute abnormality, such as acute infarction, hemorrhage, hydrocephalus, or mass lesion/mass effect. Given differences in imaging angle, similar pattern of small-vessel ischemic injury, with confluent deep cerebral white matter low-attenuation and remote lacunar infarcts in the bilateral thalami and left caudate nucleus. Brain atrophy with ex vacuo ventricular enlargement.  CT MAXILLOFACIAL FINDINGS  There is a nondisplaced fracture through the floor of the left orbit, with thin extra conal hemorrhage along the orbital floor, measuring up to 4 mm in thickness. The fracture traverses the infraorbital canal, without displacement No fat or muscle herniation. No proptosis. There is nondisplaced fracturing of the inferior  maxillary antrum, extending from the anterior to lateral walls. Left maxillary hemosinus. No additional facial fracture seen. There is enlargement of the deep superior ophthalmic veins bilaterally, which may be from Valsalva or varix formation. Appearance is unchanged.  Right nasal septal spur and deviation. Bilateral wall down mastoidectomy with minimal material in the bowls.  CT CERVICAL SPINE FINDINGS  No evidence of acute fracture or traumatic subluxation. No gross cervical canal hematoma or prevertebral edema.Diffuse degenerative disc disease - not causing significant canal stenosis however. There is facet osteoarthritis, most advanced at the C2-3 level.  IMPRESSION: 1. No evidence of acute intracranial injury. 2. Negative for acute cervical spine fracture. 3. Left orbital floor fracture with thin extraconal hemorrhage that does not cause proptosis. 4. Nondisplaced inferior left maxillary sinus fractures.   Electronically Signed   By: Tiburcio Pea M.D.   On: 12/26/2012 04:37   Dg Pelvis Portable  01/09/2013   CLINICAL DATA:  Fall, found down.  EXAM: PORTABLE PELVIS 1-2 VIEWS  COMPARISON:  Lumbar spine radiographs June 24, 2012  FINDINGS: No acute fracture deformity or dislocation. Severe right hip osteoarthrosis with mild flattening of the femoral head. Mild to moderate left hip osteoarthrosis.  Phleboliths project in the pelvis. Patient is osteopenic without destructive bony lesions. Degenerative change of the included lumbar spine. Mild vascular calcifications.  IMPRESSION: No acute fracture deformity or dislocation.  Severe right hip osteoarthrosis with mild flattening of the head concerning for secondary AVN.   Electronically Signed   By: Awilda Metro   On: 01/14/2013 04:41   Dg Chest Portable 1 View  01/02/2013   CLINICAL DATA:  Probable fold.  EXAM: PORTABLE CHEST - 1 VIEW  COMPARISON:  12/28/2012  FINDINGS: Stable chronic cardiopericardial enlargement. Lungs are better aerated than before,  although there is a retrocardiac opacity. No pneumothorax. No appreciable fracture. Cholecystectomy.  IMPRESSION: 1. Retrocardiac opacity is likely either atelectasis or aspiration given the clinical history. 2. Chronic cardiomegaly.  No edema.   Electronically Signed   By: Tiburcio Pea M.D.   On: 12/27/2012 04:39    ECG  Afib, 82, lbbb, no acute st/t changes.  ASSESSMENT AND PLAN  1.  PAF:  Pt with h/o afib presents following fall with concussion and unresponsiveness.  She has been found to be in rate-controlled afib.  She was taking amiodarone 100mg  daily @ home.  Would resume once she's taking PO's.  Cont bb.  No anticoagulation in setting of long h/o falls.  Call with questions.  2.  Cardiomyopathy:  This has been managed conservatively as an outpt in the past.  She had a MV in 2012 with inferoapical ischemia.  In setting of unresponsiveness, oral bb/acei on hold.  Volume looks good.  Call with questions.  3.  LBBB: chronic.  4.  Fall with orbital fx and concussion:  Currently unresponsive.  Per IM/Neuro.  Signed, Nicolasa Ducking, NP 01/04/2013, 2:30 PM  Patient seen with NP, agree with the above note.  - Chronic atrial fibrillation: Can use IV metoprolol while not taking po, transition back to home regimen when taking po. - Not coumadin candidate with fall risk - LBBB is chronic. - Cardiomyopathy is known and has been managed conservatively.  Get her back on home regimen when taking po.  Had echo in 2013, no real indication to repeat.   No further workup at this time, call with questions.   Marca Ancona 01/04/2013 2:51 PM

## 2013-01-04 NOTE — Progress Notes (Signed)
[facility] Progress Note [place] TEAM 1 - [facility] ICU Team    Anna House WUJ:[id number] DOB: [birth year] DOA: 13-Jan-2013 PCP: Colon Branch, MD  Brief narrative: 79 y.o. female who suffered a witnessed fall while using her walker at home. She struck the front of her head and subsequently had LOC. After EMS arrived she had 1 episode of vomiting while unconscious but seemed to guard her airway. After arrival to the ED she remained unconscious though with purposeful movements, neurology was curb sided and felt this was likely a complicated concussion. She awakened during the admission evaluation 4 hours or so after the fall. She wasn't able to speak very well due to a longstanding cleft palate but was following commands and indicated she is aware she was at the hospital.  Assessment/Plan: Active Problems:   Acute respiratory failure with hypoxia/Aspiration pneumonitis -stable on oxygen -FU CXR re: retrocardiac opacity    Fall at home with the following sequelae:    A) Concussion with 1-24 hours loss of consciousness   B) Left orbit fracture -still lethargic but more alert than yesterday -initial CT Head without acute changes -PT/OT when more alert -family describes as mechanical fall     Atrial fibrillation -variable rate but better after pain controlled and IV Lopressor started -pt is established with [facility] cardiology although family not aware - Per Cardiology note, pt was on Amio at home- dose reduced to 100 mg on 6/14- this was not our med rec and was not communicated to [place] by her family-  will resume this once alert and able to swallow     Hypokalemia -resolved after IV repletion -frequent unifocal PVCs so check Mg    Left bundle branch block -? Due to underlying ischemia (see below)    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 25% per ECHO 2013 -That echo with severely reduced LV function and significant RWMA without ventricular dilatation  which is concerning for ischemic etiology -also found with abnormal diastolic parameters which were not graded    Avascular necrosis of bone of hip -CT pelvis with findings suspicious for AVN right hip -pt with chronic hip pain with ambulation pre admit and suspect AVN etiology    Cleft palate   DVT prophylaxis: SCDs Code Status: Full Family Communication: Multiple family members at bedside Disposition Plan/Expected LOS: Step down Isolation: None Nutritional Status: Suspect a degree of chronic protein calorie malnutrition, acute protein calorie malnutrition at this point and altered mentation  Consultants: Cardiology  Procedures: None  Antibiotics: None  HPI/Subjective: Patient still lethargic but more easily arousable and makes eye contact today also noted with spontaneous purposeful movement. Requiring mittens. Seems to be in pain but unable to verbalize easily due to underlying cleft palate  Objective: Blood pressure 164/82, pulse 86, temperature 98.2 F (36.8 C), temperature source Axillary, resp. rate 20, height 5\' 3"  (1.6 m), weight 119 lb 14.9 oz (54.4 kg), SpO2 100.00%.  Intake/Output Summary (Last 24 hours) at 01/04/13 1232 Last data filed at 01/04/13 1200  Gross per 24 hour  Intake    900 ml  Output   1000 ml  Net   -100 ml     Exam: General: No acute respiratory distress ENT: Left. Orbital edema and ecchymosis Lungs: Coarse to auscultation bilaterally without wheezes or crackles, RA Cardiovascular: Irregular rate and rhythm without murmur gallop or rub normal S1 and S2, no peripheral edema or JVD Abdomen: Nontender, nondistended, soft, bowel sounds positive, no rebound, no ascites, no appreciable  mass Musculoskeletal: No significant cyanosis, clubbing of bilateral lower extremities Neurological: Alert and oriented x at least may, moves all extremities x 4 without focal neurological deficits, CN 2-12 intact  Scheduled Meds:  Scheduled Meds: .  levothyroxine  50 mcg Intravenous QAC breakfast  . metoprolol  5 mg Intravenous Q6H  . sodium chloride  3 mL Intravenous Q12H   Continuous Infusions: . sodium chloride 50 mL/hr at 01/04/13 2440    **Reviewed in detail by the Attending Physician  Data Reviewed: Basic Metabolic Panel:  Recent Labs Lab 12/28/2012 0345 01/11/2013 1800 01/04/13 0520  NA 140 142 142  K 2.9* 4.4 4.1  CL 98 102 104  CO2 27 28 29   GLUCOSE 203* 96 84  BUN 13 8 8   CREATININE 0.93 0.62 0.66  CALCIUM 9.3 8.9 9.0   Liver Function Tests:  Recent Labs Lab 01/05/2013 0345 01/04/13 0520  AST 20 15  ALT 8 6  ALKPHOS 83 76  BILITOT 0.3 0.4  PROT 7.5 6.7  ALBUMIN 3.6 3.2*   No results found for this basename: LIPASE, AMYLASE,  in the last 168 hours No results found for this basename: AMMONIA,  in the last 168 hours CBC:  Recent Labs Lab 01/09/2013 0345 01/04/13 0520  WBC 17.4* 9.7  NEUTROABS 12.7*  --   HGB 12.5 11.3*  HCT 39.6 37.0  MCV 92.5 94.1  PLT 456* 296   Cardiac Enzymes:  Recent Labs Lab 01/07/2013 1447  CKTOTAL 50   BNP (last 3 results)  Recent Labs  01/17/2013 0345  PROBNP 5356.0*   CBG: No results found for this basename: GLUCAP,  in the last 168 hours  Recent Results (from the past 240 hour(s))  MRSA PCR SCREENING     Status: None   Collection Time    01/16/2013  1:56 PM      Result Value Range Status   MRSA by PCR NEGATIVE  NEGATIVE Final   Comment:            The GeneXpert MRSA Assay (FDA     approved for NASAL specimens     only), is one component of a     comprehensive MRSA colonization     surveillance program. It is not     intended to diagnose MRSA     infection nor to guide or     monitor treatment for     MRSA infections.  URINE CULTURE     Status: None   Collection Time    12/25/2012  2:20 PM      Result Value Range Status   Specimen Description URINE, CATHETERIZED   Final   Special Requests NONE   Final   Culture  Setup Time     Final   Value:  01/15/2013 14:39     Performed at [facility]   Colony Count     Final   Value: NO GROWTH     Performed at [facility]   Culture     Final   Value: NO GROWTH     Performed at [facility]   Report Status 01/04/2013 FINAL   Final     Studies:  Recent x-ray studies have been reviewed in detail by the Attending Physician     Junious Silk, ANP [facility] Office  [phone] Pager [phone]  **If unable to reach the above provider after paging please contact the Flow Manager @ [phone]  On-Call/Text Page:      [email or web address]  password [email or web address]  If 7PM-7AM, please contact night-coverage [email or web address] Password [facility] 01/04/2013, 12:32 PM   LOS: 1 day   I have examined the patient, reviewed the chart and modified the above note which I agree with.   Lillyanne Bradburn,MD [phone] 01/04/2013, 6:12 PM

## 2013-01-04 NOTE — Progress Notes (Signed)
OT Cancellation Note  Patient Details Name: Anna House MRN: [id number] DOB: Sep 23, 1933   Cancelled Treatment:    Reason Eval/Treat Not Completed: Fatigue/lethargy limiting ability to participate  [facility] Lyza Houseworth, OTR/L  [phone] 01/04/2013 01/04/2013, 10:29 AM

## 2013-01-05 LAB — CBC
Platelets: 279 10*3/uL (ref 150–400)
RBC: 4.14 MIL/uL (ref 3.87–5.11)
RDW: 14.4 % (ref 11.5–15.5)
WBC: 12.4 10*3/uL — ABNORMAL HIGH (ref 4.0–10.5)

## 2013-01-05 LAB — BASIC METABOLIC PANEL
BUN: 12 mg/dL (ref 6–23)
CO2: 28 mEq/L (ref 19–32)
Calcium: 9 mg/dL (ref 8.4–10.5)
Chloride: 103 mEq/L (ref 96–112)
Creatinine, Ser: 0.59 mg/dL (ref 0.50–1.10)
GFR calc Af Amer: >90 mL/min (ref >90)
GFR calc non Af Amer: 86 mL/min — ABNORMAL LOW (ref >90)
Glucose, Bld: 92 mg/dL (ref 70–99)
Potassium: 3.7 mEq/L (ref 3.5–5.1)
Sodium: 142 mEq/L (ref 135–145)

## 2013-01-05 MED ORDER — DEXTROSE-NACL 5-0.9 % IV SOLN
INTRAVENOUS | Status: DC
  Administered 2013-01-05 – 2013-01-07 (×3): via INTRAVENOUS

## 2013-01-05 NOTE — Progress Notes (Signed)
[facility] Progress Note [place] TEAM 1 - [facility] ICU Team   Anna House UJW:[id number] DOB: September 02, 1933 DOA: 01/10/2013 PCP: Colon Branch, MD  Brief narrative: 79 y.o. female who suffered a witnessed fall while using her walker at home. She struck the front of her head and subsequently had LOC. After EMS arrived she had 1 episode of vomiting while unconscious but seemed to guard her airway. After arrival to the ED she remained unconscious though with purposeful movements, neurology was curb sided and felt this was likely a complicated concussion. She awakened during the admission evaluation 4 hours or so after the fall. She wasn't able to speak very well due to a longstanding cleft palate but was following commands and indicated she is aware she was at the hospital.  Assessment/Plan:    Acute respiratory failure with hypoxia / Aspiration pneumonitis -stable on oxygen -FU CXR in a.m. re: retrocardiac opacity    Fall at home with the following sequelae:    A) Concussion with 1-24 hours loss of consciousness   B) Left orbital fracture -more alert than yesterday- very HOH and not wearing hearing aids so may be influencing "responsiveness" -initial CT Head without acute changes -PT/OT rec SNF at dc- family agreeable -family confirms this was a pure mechanical fall -if pt not alert enough to tolerate diet by 12/18 will have to consider temporary tube feeds      Atrial fibrillation -variable rate but better after pain controlled and IV Lopressor started -pt is established with [facility]  - Per Cardiology note, pt was on Amio at home - dose reduced to 100 mg on 6/14 - this was not our med rec and was not communicated to [place] by her family - resume this once alert and able to swallow     Hypokalemia -resolved after IV repletion -frequent unifocal PVCs    Left bundle branch block -chronic per Cardiolgoy     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  25% per ECHO 2013 -2013 echo with severely reduced LV function and significant RWMA without ventricular dilatation which is concerning for ischemic etiology -also found with abnormal diastolic parameters which were not graded    Avascular necrosis of hip -CT pelvis with findings suspicious for AVN right hip -pt with chronic hip pain with ambulation pre admit and suspect AVN etiology -d/w family that pt not a candidate for hip replacement (risk > benefit) and they agree    Hx of repaired Cleft palate -SLP eval prior to begin diet  DVT prophylaxis: SCDs Code Status: Full Family Communication: Multiple family members at bedside Disposition Plan/Expected LOS: Transfer to floor - ultimate goal is d/c to SNF (family agrees) once alert enough to eat   Consultants: Cardiology  Procedures: None  Antibiotics: None  HPI/Subjective: Patient more awake and up in chair - opens eyes but mostly nonverbal and therefore unable to provide a hx.    Objective: Blood pressure 180/134, pulse 48, temperature 99.4 F (37.4 C), temperature source Axillary, resp. rate 24, height 5\' 3"  (1.6 m), weight 119 lb 14.9 oz (54.4 kg), SpO2 98.00%.  Intake/Output Summary (Last 24 hours) at 01/05/13 1118 Last data filed at 01/05/13 0957  Gross per 24 hour  Intake    953 ml  Output    600 ml  Net    353 ml   Exam: General: No acute respiratory distress ENT: L orbital edema has decreased, ecchymosis maturing  Lungs: Clear to auscultation bilaterally without wheezes or crackles,  RA Cardiovascular: Irregular rate and rhythm without murmur gallop or rub normal S1 and S2, no peripheral edema or JVD Abdomen: Nontender, nondistended, soft, bowel sounds positive, no rebound, no ascites, no appreciable mass Musculoskeletal: No significant cyanosis, clubbing of bilateral lower extremities Neurological: Alert and oriented x at least to name, moves all extremities x 4 without focal neurological deficits, CN 2-12  intact  Scheduled Meds:  Scheduled Meds: . levothyroxine  50 mcg Intravenous QAC breakfast  . metoprolol  5 mg Intravenous Q6H  . sodium chloride  3 mL Intravenous Q12H   Data Reviewed: Basic Metabolic Panel:  Recent Labs Lab 01/09/2013 0345 01/05/2013 1800 01/04/13 0520 01/05/13 0435  NA 140 142 142 142  K 2.9* 4.4 4.1 3.7  CL 98 102 104 103  CO2 27 28 29 28   GLUCOSE 203* 96 84 92  BUN 13 8 8 12   CREATININE 0.93 0.62 0.66 0.59  CALCIUM 9.3 8.9 9.0 9.0   Liver Function Tests:  Recent Labs Lab 12/24/2012 0345 01/04/13 0520  AST 20 15  ALT 8 6  ALKPHOS 83 76  BILITOT 0.3 0.4  PROT 7.5 6.7  ALBUMIN 3.6 3.2*   CBC:  Recent Labs Lab 12/30/2012 0345 01/04/13 0520 01/05/13 0435  WBC 17.4* 9.7 12.4*  NEUTROABS 12.7*  --   --   HGB 12.5 11.3* 12.1  HCT 39.6 37.0 39.4  MCV 92.5 94.1 95.2  PLT 456* 296 279   Cardiac Enzymes:  Recent Labs Lab 12/21/2012 1447  CKTOTAL 50   BNP (last 3 results)  Recent Labs  12/28/2012 0345  PROBNP 5356.0*     Recent Results (from the past 240 hour(s))  MRSA PCR SCREENING     Status: None   Collection Time    01/09/2013  1:56 PM      Result Value Range Status   MRSA by PCR NEGATIVE  NEGATIVE Final   Comment:            The GeneXpert MRSA Assay (FDA     approved for NASAL specimens     only), is one component of a     comprehensive MRSA colonization     surveillance program. It is not     intended to diagnose MRSA     infection nor to guide or     monitor treatment for     MRSA infections.  URINE CULTURE     Status: None   Collection Time    12/22/2012  2:20 PM      Result Value Range Status   Specimen Description URINE, CATHETERIZED   Final   Special Requests NONE   Final   Culture  Setup Time     Final   Value: 01/18/2013 14:39     Performed at [facility]   Colony Count     Final   Value: NO GROWTH     Performed at [facility]   Culture     Final   Value: NO GROWTH     Performed at [facility]   Report Status 01/04/2013 FINAL   Final     Studies:  Recent x-ray studies have been reviewed in detail by the Attending Physician     Junious Silk, ANP [facility] Office  [phone] Pager [phone]  **If unable to reach the above provider after paging please contact the Flow Manager @ [phone]  On-Call/Text Page:      [email or web address]      password [facility]  If 7PM-7AM, please contact night-coverage [email or web address] Password [email or web address] 01/05/2013, 11:18 AM   LOS: 2 days   I have personally examined this patient and reviewed the entire database. I have reviewed the above note, made any necessary editorial changes, and agree with its content.  Lonia Blood, MD [facility]

## 2013-01-05 NOTE — Progress Notes (Signed)
Clinical Social Work Department BRIEF PSYCHOSOCIAL ASSESSMENT 01/05/2013  Patient:  KASARA, SCHOMER     Account Number:  [id number]     Admit date:  01/02/2013  Clinical Social Worker:  Varney Biles  Date/Time:  01/05/2013 12:48 PM  Referred by:  Physician  Date Referred:  01/05/2013 Referred for  SNF Placement   Other Referral:   Interview type:  Family Other interview type:    PSYCHOSOCIAL DATA Living Status:  ALONE Admitted from facility:   Level of care:   Primary support name:  Carolyn Stare [phone] & [phone]) Primary support relationship to patient:  SIBLING Degree of support available:   Good--pt's sister spoke with CSW over the phone, is visiting pt in the hospital.    CURRENT CONCERNS Current Concerns  Post-Acute Placement   Other Concerns:    SOCIAL WORK ASSESSMENT / PLAN CSW explained to pt's sister that PT is recommending SNF. CSW asked sister where she would like clinicals sent--sister states they live in [place]. CSW sent clinicals to all 5 SNFs in [place]. CSW submitted for PASARR and received a level II notification--CSW following up on this now.   Assessment/plan status:  Psychosocial Support/Ongoing Assessment of Needs Other assessment/ plan:   Information/referral to community resources:   SNF    PATIENT'S/FAMILY'S RESPONSE TO PLAN OF CARE: Sister receptive to CSW phone call and appreciative of CSW assistance. CSW will update pt if/when bed offers are made and will expand search if necessary.       Maryclare Labrador, MSW, [facility] Clinical Social Worker [phone]

## 2013-01-05 NOTE — Evaluation (Signed)
SLP Cancellation Note  Patient Details Name: Anna House MRN: [id number] DOB: 31-Dec-1933   Cancelled treatment:       Reason Eval/Treat Not Completed: Fatigue/lethargy limiting ability to participate  SLP second attempt to see pt, pt continues to remain asleep/lethargic.  She did not awaken adequately with verbal/auditory stimulation.    Sister and niece present and admit to occasional issues with pt strangling on liquids prior to admission. Sister reports pt had cleft palate repaired at approx 79 years of age.  Pt also had required esophageal dilatation in the past.  SLP to continue efforts to conduct swallow eval, rec continue NPO.    Donavan Burnet, MS [facility] SLP [phone]    Chales Abrahams 01/05/2013, 11:53 AM

## 2013-01-05 NOTE — Evaluation (Signed)
Occupational Therapy Evaluation Patient Details Name: RISHA BARRETTA MRN: [id number] DOB: 02-01-1933 Today's Date: 01/05/2013 Time: 4098-1191 OT Time Calculation (min): 28 min  OT Assessment / Plan / Recommendation History of present illness Pt admit after fall at home with concussion and AMS as well as left orbital fracture.     Clinical Impression   Pt admitted with above. Pt will benefit from continued acute OT services to address below problem list. Recommending SNF for d/c planning.    OT Assessment  Patient needs continued OT Services    Follow Up Recommendations  SNF    Barriers to Discharge      Equipment Recommendations   (TBD at next venue)    Recommendations for Other Services    Frequency  Min 2X/week    Precautions / Restrictions Precautions Precautions: Fall   Pertinent Vitals/Pain BP running high.  At end of session, BP 180/92 while pt sitting in chair. RN made aware.    ADL  Grooming: Performed;Wash/dry face;+1 Total assistance Where Assessed - Grooming: Supported sitting Lower Body Dressing: Performed;+1 Total assistance Where Assessed - Lower Body Dressing: Supine, head of bed up Toilet Transfer: Simulated;+2 Total assistance Toilet Transfer: Patient Percentage: 10% Toilet Transfer Method: Stand pivot Acupuncturist:  (bed>recliner) ADL Comments: Pt presenting with poor neck extension in sitting positiong, tendence to keep flexed.  Once reclined in chair at end of session, therapist assisted with PROM to neck to facilitate extension.  Pt unable to hold head upright >5 seconds before flexing forward again.  Also noted tightness in neck and traps.    OT Diagnosis: Generalized weakness;Cognitive deficits  OT Problem List: Decreased strength;Decreased activity tolerance;Impaired balance (sitting and/or standing);Decreased cognition;Decreased knowledge of use of DME or AE OT Treatment Interventions: Self-care/ADL training;Therapeutic  exercise;Therapeutic activities;Cognitive remediation/compensation;Patient/family education   OT Goals(Current goals can be found in the care plan section) Acute Rehab OT Goals Patient Stated Goal: unable to state OT Goal Formulation: With family Time For Goal Achievement: 01/19/13 Potential to Achieve Goals: Good  Visit Information  Last OT Received On: 01/05/13 Assistance Needed: +2 PT/OT/SLP Co-Evaluation/Treatment: Yes Reason for Co-Treatment: For patient/therapist safety OT goals addressed during session: ADL's and self-care History of Present Illness: Pt admit after fall at home with concussion and AMS as well as left orbital fracture.         Prior Functioning     Home Living Family/patient expects to be discharged to:: Skilled nursing facility Living Arrangements: Other relatives (another sister) Additional Comments: H/o frequent falls at home per niece report. Prior Function Level of Independence: Needs assistance Gait / Transfers Assistance Needed: Ambulates with 4 wheeled walker. ADL's / Homemaking Assistance Needed: Able to complete dressing ADL independently. HH aide assisted with bathing and meal prep. Communication Communication:  (did not speak but moaned several times. h/o cleft palate.)         Vision/Perception Vision - Assessment Additional Comments: Unable to assess due to impaired cognition/level of arousal.  Bruising and swelling noted around left eye.  Pt opening both eyes toward end of session when in chair. Will continue to assess.   Cognition  Cognition Arousal/Alertness: Lethargic Behavior During Therapy: Flat affect Overall Cognitive Status: Difficult to assess Area of Impairment: Following commands Following Commands: Follows one step commands inconsistently;Follows one step commands with increased time Difficult to assess due to: Level of arousal    Extremity/Trunk Assessment Upper Extremity Assessment Upper Extremity Assessment: RUE  deficits/detail;LUE deficits/detail;Generalized weakness;Difficult to assess due to impaired cognition RUE  Deficits / Details: Increased tone bilaterally. PROM (pt not actively initiating movement). LUE Deficits / Details: Increased tone bilaterally. PROM (pt not actively initiating movement). Cervical / Trunk Assessment Cervical / Trunk Assessment: Kyphotic     Mobility Bed Mobility Bed Mobility: Supine to Sit;Sitting - Scoot to Edge of Bed Supine to Sit: 1: +2 Total assist;HOB elevated Supine to Sit: Patient Percentage: 10% Sitting - Scoot to Edge of Bed: 1: +2 Total assist Sitting - Scoot to Edge of Bed: Patient Percentage: 0% Details for Bed Mobility Assistance: Pt intitiated some truncal activation with supine to sit but required +2 total assist to complete transition to sitting EOB. Transfers Transfers: Sit to Stand;Stand to Sit Sit to Stand: 1: +2 Total assist;From bed Sit to Stand: Patient Percentage: 10% Stand to Sit: 1: +2 Total assist;To chair/3-in-1 Stand to Sit: Patient Percentage: 10% Details for Transfer Assistance: Use of draw pad to facilitate power up from bed.     Exercise     Balance Balance Balance Assessed: Yes Static Sitting Balance Static Sitting - Balance Support: Feet supported;Bilateral upper extremity supported Static Sitting - Level of Assistance: 2: Max assist;4: Min assist Static Sitting - Comment/# of Minutes: Pt sat EOB 10 minutes. Initially requiring assist for balance due to right posterior lean.  Pt then able to maintain balance in midline with intermittent tactile cues to prevent right lean.   End of Session OT - End of Session Equipment Utilized During Treatment: Gait belt Activity Tolerance: Patient limited by lethargy Patient left: in chair;with call bell/phone within reach;with family/visitor present Nurse Communication: Mobility status  GO    01/05/2013 Cipriano Mile OTR/L Pager [phone] Office [phone]  Cipriano Mile 01/05/2013, 9:31 AM

## 2013-01-05 NOTE — Progress Notes (Signed)
Called [address] [place] and gave report to nurse as pt is moving to this med-surg floor per md orders. CNA and nurse taking pt to floor now per md orders. Pt's foley catheter removed per floor policy. PT had patient up in chair for a couple of hrs this am. Pt's family visiting all morning and following family to [facility] floor.

## 2013-01-05 NOTE — Progress Notes (Signed)
Physical Therapy Treatment Patient Details Name: Anna House MRN: [id number] DOB: 30-Oct-1933 Today's Date: 01/05/2013 Time: 1478-2956 PT Time Calculation (min): 28 min  PT Assessment / Plan / Recommendation  History of Present Illness Pt admit after fall at home with concussion and AMS as well as left orbital fracture.     PT Comments   Pt admitted with above. Pt currently with functional limitations due to balance and endurance deficits. Pt will benefit from skilled PT to increase their independence and safety with mobility to allow discharge to the venue listed below.   Follow Up Recommendations  SNF;Supervision/Assistance - 24 hour                 Equipment Recommendations  Other (comment) (TBA)        Frequency Min 3X/week   Progress towards PT Goals Progress towards PT goals: Progressing toward goals  Plan Current plan remains appropriate    Precautions / Restrictions Precautions Precautions: Fall Restrictions Weight Bearing Restrictions: No   Pertinent Vitals/Pain 180/92 BP after pt in chair.  89% O2 on RA.  94% on 2LO2. No pain.    Mobility  Bed Mobility Bed Mobility: Supine to Sit;Sitting - Scoot to Edge of Bed Supine to Sit: 1: +2 Total assist;HOB elevated Supine to Sit: Patient Percentage: 10% Sitting - Scoot to Edge of Bed: 1: +2 Total assist Sitting - Scoot to Edge of Bed: Patient Percentage: 0% Details for Bed Mobility Assistance: Pt intitiated some truncal activation with supine to sit but required +2 total assist to complete transition to sitting EOB. Transfers Transfers: Squat Pivot Transfers Sit to Stand: 1: +2 Total assist;From bed Sit to Stand: Patient Percentage: 10% Stand to Sit: 1: +2 Total assist;To chair/3-in-1 Stand to Sit: Patient Percentage: 10% Details for Transfer Assistance: Use of draw pad to facilitate power up from bed. Pt beared weight on feet but did not help much at all.   Ambulation/Gait Ambulation/Gait Assistance: Not tested  (comment) Stairs: No Wheelchair Mobility Wheelchair Mobility: No    PT Goals (current goals can now be found in the care plan section) Acute Rehab PT Goals Patient Stated Goal: unable to state  Visit Information  Last PT Received On: 01/05/13 Assistance Needed: +2 PT/OT/SLP Co-Evaluation/Treatment: Yes Reason for Co-Treatment: For patient/therapist safety PT goals addressed during session: Mobility/safety with mobility OT goals addressed during session: ADL's and self-care History of Present Illness: Pt admit after fall at home with concussion and AMS as well as left orbital fracture.      Subjective Data  Subjective: Mumbling but not intelligible.  Family reminded PT/OT that pt with cleft palate. Patient Stated Goal: unable to state   Cognition  Cognition Arousal/Alertness: Lethargic Behavior During Therapy: Flat affect Overall Cognitive Status: Difficult to assess Area of Impairment: Following commands Following Commands: Follows one step commands inconsistently;Follows one step commands with increased time Difficult to assess due to: Level of arousal    Balance  Balance Balance Assessed: Yes Static Sitting Balance Static Sitting - Balance Support: Feet supported;Bilateral upper extremity supported Static Sitting - Level of Assistance: 2: Max assist;4: Min assist Static Sitting - Comment/# of Minutes: Pt sat EOB 10 min.  Initially requiring assist for balance due to right posterior lean.  Pt then able to maintain balance in midline with intermittent tactile cues to prevent lean.  End of Session PT - End of Session Equipment Utilized During Treatment: Gait belt;Oxygen Activity Tolerance: Patient limited by fatigue Patient left: in bed;with call bell/phone within  reach Nurse Communication: Mobility status        INGOLD,Chairty Toman 01/05/2013, 9:40 AM [facility] [phone] (pager)

## 2013-01-05 NOTE — Progress Notes (Signed)
CSW called Vaughan Basta with [facility] to get [facility] for pt, as CSW received message on their website stating: "Negative screen submitted on pt with level II history. Submit corrected screen as well as the h&p, psych notes and fl-2." CSW asked Caryl Asp to explain how to submit corrected screen. CSW will submit requested clinicals when Caryl Asp returns call.   Maryclare Labrador, MSW, [facility] Clinical Social Worker [phone]

## 2013-01-05 NOTE — Progress Notes (Signed)
Physical Therapy Patient Details Name: BRAELIN BROSCH MRN: [id number] DOB: 06-Sep-1933 Today's Date: 01/05/2013   Niece and sister present and gave some information re: pt home situation.                                                     Prior Functioning  Home Living Family/patient expects to be discharged to:: Skilled nursing facility Living Arrangements: Other relatives (sister) Available Help at Discharge: Family;Available PRN/intermittently;Personal care attendant (aide several days a week) Type of Home: House Home Access: Ramped entrance Home Layout: One level Home Equipment: Walker - 2 wheels;Bedside commode;Shower seat Additional Comments: H/o frequent falls at home per niece report.                     INGOLD,Zareena Willis 01/05/2013, 1:16 PM [facility] [phone] (pager)

## 2013-01-05 NOTE — Evaluation (Signed)
SLP Cancellation Note  Patient Details Name: Anna House MRN: [id number] DOB: May 13, 1933   Cancelled treatment:       Reason Eval/Treat Not Completed: Fatigue/lethargy limiting ability to participate.  Spoke to RN who reports pt not appropriate at this time for swallow evaluation.  Will re-attempt as schedule allows.     Donavan Burnet, MS [facility] SLP [phone]

## 2013-01-05 NOTE — Progress Notes (Signed)
Clinical Social Work Department CLINICAL SOCIAL WORK PLACEMENT NOTE 01/05/2013  Patient:  Anna House, Anna House  Account Number:  [id number] Admit date:  02/12/2013  Clinical Social Worker:  Maryclare Labrador, Theresia Majors  Date/time:  01/05/2013 12:55 PM  Clinical Social Work is seeking post-discharge placement for this patient at the following level of care:   SKILLED NURSING   (*CSW will update this form in Epic as items are completed)   N/A-pt list not provided as information was provided to sister over the phone  Patient/family provided with [facility] Department of Clinical Social Work's list of facilities offering this level of care within the geographic area requested by the patient (or if unable, by the patient's family).  01/05/2013  Patient/family informed of their freedom to choose among providers that offer the needed level of care, that participate in Medicare, Medicaid or managed care program needed by the patient, have an available bed and are willing to accept the patient.  N/A-clinicals not submitted to this facility  Patient/family informed of [facility]' ownership interest in [facility], as well as of the fact that they are under no obligation to receive care at this facility.  PASARR submitted to [employer] on 01/05/2013 PAS[facility] number received from [employer] on 01/05/2013  FL2 transmitted to all facilities in geographic area requested by pt/family on  01/05/2013 FL2 transmitted to all facilities within larger geographic area on   Patient informed that his/her managed care company has contracts with or will negotiate with  certain facilities, including the following:     Patient/family informed of bed offers received:   Patient chooses bed at  Physician recommends and patient chooses bed at    Patient to be transferred to  on   Patient to be transferred to facility by   The following physician request were entered in Epic:   Additional Comments:   Maryclare Labrador, MSW,  [facility] Clinical Social Worker [phone]

## 2013-01-06 ENCOUNTER — Inpatient Hospital Stay (HOSPITAL_COMMUNITY): Payer: Medicare Other

## 2013-01-06 LAB — GLUCOSE, CAPILLARY: Glucose-Capillary: 145 mg/dL — ABNORMAL HIGH (ref 70–99)

## 2013-01-06 MED ORDER — JEVITY 1.2 CAL PO LIQD
ORAL | Status: AC
  Filled 2013-01-06: qty 237

## 2013-01-06 MED ORDER — JEVITY 1.2 CAL PO LIQD
1000.0000 mL | ORAL | Status: DC
  Administered 2013-01-06 – 2013-01-12 (×4): 1000 mL
  Filled 2013-01-06 (×6): qty 1000
  Filled 2013-01-06: qty 237
  Filled 2013-01-06 (×3): qty 1000

## 2013-01-06 NOTE — Progress Notes (Addendum)
NUTRITION FOLLOW UP  Intervention:   1.  Enteral nutrition; initiate Jevity 1.2 @ 20 mL/hr continuous.  Advance by 10 mL q 4 hrs to 50 mL/hr goal to provide 1440 kcal, 67g protein, 972 mL free water. 2.  Modify diet; resume PO diet once medically appropriate per SLP/MD discretion.  Nutrition Dx:   Inadequate oral intake, ongoing  Monitor:   1.  Enteral nutrition; initiation with tolerance.  Pt to meet >/=90% estimated needs with nutrition support.  2.  Wt/wt change; monitor trends 3.  Food/Beverage; resume of PO diet with pt meeting >/=90% estimated needs with tolerance.  Assessment:   Patient lives with sister and had a witnessed fall using her walker. Sustained concussion and orbital fracture.  Patient is unsafe for po until more regularly alert.  SLP recommended short-term nutrition support. Patient has a long standing cleft palate. Unable to reach family to obtain hx. Patient has had an 8% weight loss in the past 8 months. Currently with post-pyloric tube.   RD consulted for enteral initiation and management.  Post-pyloric tube placement.  Pt currently sleeping soundly.  Unable to wake.  No family at bedside. Discussed with RN.  RD to place TF orders.    Height: Ht Readings from Last 1 Encounters:  2013/01/20 5\' 3"  (1.6 m)    Weight Status:   Wt Readings from Last 1 Encounters:  2013/01/20 119 lb 14.9 oz (54.4 kg)    Re-estimated needs:  Kcal: 1400-1600 Protein: 65-75g Fluid: >1.5 L/day  Skin: intact, bruising around eye  Diet Order: NPO   Intake/Output Summary (Last 24 hours) at 01/06/13 1619 Last data filed at 01/06/13 0517  Gross per 24 hour  Intake 764.17 ml  Output      0 ml  Net 764.17 ml    Last BM: PTA   Labs:   Recent Labs Lab 2013-01-20 1800 01/04/13 0520 01/05/13 0435  NA 142 142 142  K 4.4 4.1 3.7  CL 102 104 103  CO2 28 29 28   BUN 8 8 12   CREATININE 0.62 0.66 0.59  CALCIUM 8.9 9.0 9.0  GLUCOSE 96 84 92    CBG (last 3)  No results  found for this basename: GLUCAP,  in the last 72 hours  Scheduled Meds: . levothyroxine  50 mcg Intravenous QAC breakfast  . metoprolol  5 mg Intravenous Q6H  . sodium chloride  3 mL Intravenous Q12H    Continuous Infusions: . dextrose 5 % and 0.9% NaCl 50 mL/hr at 01/06/13 1408    Loyce Dys, MS RD LDN Clinical Inpatient Dietitian Pager: [phone] Weekend/After hours pager: [phone]

## 2013-01-06 NOTE — Progress Notes (Signed)
Physical Therapy Treatment Patient Details Name: Anna House MRN: [id number] DOB: 1933-11-24 Today's Date: 01/06/2013 Time: 7846-9629 PT Time Calculation (min): 25 min  PT Assessment / Plan / Recommendation  History of Present Illness Pt admit after fall at home with concussion and AMS as well as left orbital fracture.     PT Comments   Patient still limited by fatigue despite attempts of stimulation in sitting. Continue to recommend SNF for ongoing Physical Therapy.     Follow Up Recommendations  SNF;Supervision/Assistance - 24 hour     Does the patient have the potential to tolerate intense rehabilitation     Barriers to Discharge        Equipment Recommendations       Recommendations for Other Services    Frequency Min 3X/week   Progress towards PT Goals Progress towards PT goals: Progressing toward goals  Plan Current plan remains appropriate    Precautions / Restrictions Precautions Precautions: Fall   Pertinent Vitals/Pain no apparent distress     Mobility  Bed Mobility Bed Mobility: Sit to Supine Supine to Sit: 1: +2 Total assist;HOB elevated Supine to Sit: Patient Percentage: 10% Sit to Supine: 1: +2 Total assist Sit to Supine: Patient Percentage: 10% Details for Bed Mobility Assistance: Pt intitiated some truncal activation with supine to sit but required +2 total assist  Transfers Sit to Stand: 1: +2 Total assist;From bed Sit to Stand: Patient Percentage: 10% Stand to Sit: 1: +2 Total assist;To chair/3-in-1 Stand to Sit: Patient Percentage: 10% Details for Transfer Assistance: Use of draw pad to facilitate power up from bed. Pt beared weight on feet but did not help much at all.   Ambulation/Gait Ambulation/Gait Assistance: Not tested (comment)    Exercises     PT Diagnosis:    PT Problem List:   PT Treatment Interventions:     PT Goals (current goals can now be found in the care plan section)    Visit Information  Last PT Received On:  01/06/13 Assistance Needed: +2 History of Present Illness: Pt admit after fall at home with concussion and AMS as well as left orbital fracture.      Subjective Data      Cognition  Cognition Arousal/Alertness: Lethargic Behavior During Therapy: Flat affect Overall Cognitive Status: Difficult to assess Area of Impairment: Following commands Following Commands: Follows one step commands inconsistently;Follows one step commands with increased time Difficult to assess due to: Level of arousal    Balance     End of Session PT - End of Session Equipment Utilized During Treatment: Gait belt;Oxygen Activity Tolerance: Patient limited by fatigue Patient left: in bed;with call bell/phone within reach Nurse Communication: Mobility status   GP     Fredrich Birks 01/06/2013, 2:52 PM 01/06/2013 Fredrich Birks PTA [phone] pager [phone] office

## 2013-01-06 NOTE — Progress Notes (Signed)
Brief Note: [facility] team placed small bowel feeding tube. X-ray to follow.  Gavriel Holzhauer RD, LDN, CNSC [phone] Pager [phone] After Hours Pager   

## 2013-01-06 NOTE — Progress Notes (Signed)
[facility] Progress Note [place] TEAM 1 - [facility] ICU Team   Anna House ZOX:[id number] DOB: [birth year] DOA: Jan 05, 2013 PCP: Colon Branch, MD  Brief narrative: 79 y.o. female who suffered a witnessed fall while using her walker at home. She struck the front of her head and subsequently had LOC. After EMS arrived she had 1 episode of vomiting while unconscious but seemed to guard her airway. After arrival to the ED she remained unconscious though with purposeful movements, neurology was curb sided and felt this was likely a complicated concussion. She awakened during the admission evaluation 4 hours or so after the fall. She wasn't able to speak very well due to a longstanding cleft palate but was following commands and indicated she is aware she was at the hospital.  Assessment/Plan:    Acute respiratory failure with hypoxia / Aspiration pneumonitis -stable on oxygen - retrocardiac opacities stable and may be scar tissue    Fall at home with the following sequelae:    A) Concussion with 1-24 hours loss of consciousness   B) Left orbital fracture -more alert than yesterday- very HOH and not wearing hearing aids so may be influencing "responsiveness" -initial CT Head without acute changes -PT/OT rec SNF at dc- family agreeable -family confirms this was a pure mechanical fall -still not alert enough for oral diet so will have small bore feeding tube placed under fluoroscopy (known cleft palate, right deviated septum with spur, and recent left orbital floor fx) -will ask nutritoin for TF recs     Atrial fibrillation -variable rate but better after pain controlled and IV Lopressor started -pt is established with [facility]  - Per Cardiology note, pt was on Amio at home - dose reduced to 100 mg on 6/14 - this was not our med rec and was not communicated to [place] by her family - resume this once feeding tube placed     Hypokalemia -resolved after IV repletion -frequent  unifocal PVCs    Left bundle branch block -chronic per Cardiolgoy     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 25% per ECHO 2013 -2013 echo with severely reduced LV function and significant RWMA without ventricular dilatation which is concerning for ischemic etiology -also found with abnormal diastolic parameters which were not graded    Avascular necrosis of hip -CT pelvis with findings suspicious for AVN right hip -pt with chronic hip pain with ambulation pre admit and suspect AVN etiology -d/w family that pt not a candidate for hip replacement (risk > benefit) and they agree    Hx of repaired Cleft palate -SLP eval prior to begin diet  DVT prophylaxis: SCDs Code Status: Full Family Communication: Multiple family members at bedside-Lena agreed to feeding tube Disposition Plan/Expected LOS:  floor - ultimate goal is d/c to SNF (family agrees) once alert enough to eat   Consultants: Cardiology  Procedures: None  Antibiotics: None  HPI/Subjective: Patient less awake today- hearing aids in place    Objective: Blood pressure 154/60, pulse 99, temperature 97.3 F (36.3 C), temperature source Axillary, resp. rate 17, height 5\' 3"  (1.6 m), weight 119 lb 14.9 oz (54.4 kg), SpO2 98.00%.  Intake/Output Summary (Last 24 hours) at 01/06/13 1402 Last data filed at 01/06/13 0517  Gross per 24 hour  Intake 764.17 ml  Output      0 ml  Net 764.17 ml   Exam: General: No acute respiratory distress ENT: L orbital edema has decreased, ecchymosis maturing  Lungs: Clear to  auscultation bilaterally without wheezes or crackles, RA Cardiovascular: Irregular rate and rhythm without murmur gallop or rub normal S1 and S2, no peripheral edema or JVD Abdomen: Nontender, nondistended, soft, bowel sounds positive, no rebound, no ascites, no appreciable mass Musculoskeletal: No significant cyanosis, clubbing of bilateral lower extremities Neurological: Lethargic and awakens  briefly to tactile stimulation, spontaneous and weak purposeful movement  Scheduled Meds:  Scheduled Meds: . levothyroxine  50 mcg Intravenous QAC breakfast  . metoprolol  5 mg Intravenous Q6H  . sodium chloride  3 mL Intravenous Q12H   Data Reviewed: Basic Metabolic Panel:  Recent Labs Lab 12/23/2012 0345 12/22/2012 1800 01/04/13 0520 01/05/13 0435  NA 140 142 142 142  K 2.9* 4.4 4.1 3.7  CL 98 102 104 103  CO2 27 28 29 28   GLUCOSE 203* 96 84 92  BUN 13 8 8 12   CREATININE 0.93 0.62 0.66 0.59  CALCIUM 9.3 8.9 9.0 9.0   Liver Function Tests:  Recent Labs Lab 12/25/2012 0345 01/04/13 0520  AST 20 15  ALT 8 6  ALKPHOS 83 76  BILITOT 0.3 0.4  PROT 7.5 6.7  ALBUMIN 3.6 3.2*   CBC:  Recent Labs Lab 01/06/2013 0345 01/04/13 0520 01/05/13 0435  WBC 17.4* 9.7 12.4*  NEUTROABS 12.7*  --   --   HGB 12.5 11.3* 12.1  HCT 39.6 37.0 39.4  MCV 92.5 94.1 95.2  PLT 456* 296 279   Cardiac Enzymes:  Recent Labs Lab 01/11/2013 1447  CKTOTAL 50   BNP (last 3 results)  Recent Labs  01/11/2013 0345  PROBNP 5356.0*     Recent Results (from the past 240 hour(s))  MRSA PCR SCREENING     Status: None   Collection Time    01/01/2013  1:56 PM      Result Value Range Status   MRSA by PCR NEGATIVE  NEGATIVE Final   Comment:            The GeneXpert MRSA Assay (FDA     approved for NASAL specimens     only), is one component of a     comprehensive MRSA colonization     surveillance program. It is not     intended to diagnose MRSA     infection nor to guide or     monitor treatment for     MRSA infections.  URINE CULTURE     Status: None   Collection Time    01/13/2013  2:20 PM      Result Value Range Status   Specimen Description URINE, CATHETERIZED   Final   Special Requests NONE   Final   Culture  Setup Time     Final   Value: 01/01/2013 14:39     Performed at [facility]   Colony Count     Final   Value: NO GROWTH     Performed at [facility]    Culture     Final   Value: NO GROWTH     Performed at [facility]   Report Status 01/04/2013 FINAL   Final     Studies:  Recent x-ray studies have been reviewed in detail by the Attending Physician     Junious Silk, ANP [facility] Office  [phone] Pager [phone]  **If unable to reach the above provider after paging please contact the Flow Manager @ [phone]  On-Call/Text Page:      [email or web address]      password [email or web address]  If 7PM-7AM,  please contact night-coverage [email or web address] Password [email or web address] 01/06/2013, 2:02 PM   LOS: 3 days    I have examined the patient, reviewed the chart and modified the above note which I agree with.   Gracielynn Birkel,MD [phone] 01/06/2013, 3:55 PM

## 2013-01-06 NOTE — Evaluation (Signed)
Clinical/Bedside Swallow Evaluation Patient Details  Name: Anna House MRN: [id number] Date of Birth: Jun 15, 1933  Today's Date: 01/06/2013 Time: 0900-0910 SLP Time Calculation (min): 10 min  Past Medical History:  Past Medical History  Diagnosis Date  . Essential hypertension, benign   . PAF (paroxysmal atrial fibrillation)     a. on amiodarone (dose reduced to 100mg  daily 06/2012);  b. Not felt to be anticoagulation candidate.  . Hypothyroidism   . Left bundle branch block   . Cleft palate   . Cardiomyopathy     a. 2012 Cardiolite: small inferoapical ischemic defect-> conservative mgmt;  b. 08/2011 Echo: EF 20-25%, multiple wma's, diastolic dysfxn, nl valves, nl LA size.  . Pulmonary hypertension   . Frequent falls   . Concussion     a. 12/2012  . Left Orbital Fracture     a. 12/2012   Past Surgical History:  Past Surgical History  Procedure Laterality Date  . Colonoscopy  01/09/2011    Procedure: COLONOSCOPY;  Surgeon: Malissa Hippo, MD;  Location: [facility];  Service: Endoscopy;  Laterality: N/A;  2:00 pm   HPI:  Anna House is a 79 y.o. female who suffered a witnessed fall while using her walker at home admitted on Feb 01, 2013.  She struck the front of her head and subsequently had LOC.  After EMS arrived she had 1 episode of vomiting while unconscious but seemed to guard her airway per MD note.  After arrival to the ED she remained unconscious though with purposeful movements, neurology was curb sided and felt this was likely a complicated concussion.  Pt isn't able to speak very well at baseline due to a longstanding cleft palate.  Swallow evaluation has been ordered but hindered by pt's lethargy.     Assessment / Plan / Recommendation Clinical Impression  Pt continues with lethargy that is prohibiting her ability to consume po.  Pt did awaken with maximial verbal/tactile stimulation to phonate briefly but quickly returns to sleep.  Moisture provided by SLP via  toothette resulted in delayed swallow response and immediate cough - suspect aspiration.  Pt remains inappropriate for po at this time.    Rec consider short term alternative means of nutrition given ongoing lethargy, if plan to place small bore feeding tube - would recommend consider placement under flouro given pt has had cleft palate repair.    SLP to follow for readiness for po intake and/or instrumental testing indication.  Again, per pt's sister yesterday, pt would infrequently choke on liquids prior to admit.  RN informed and agreeable to plan.      Aspiration Risk  Severe    Diet Recommendation NPO;Alternative means - temporary   Medication Administration: Via alternative means    Other  Recommendations Oral Care Recommendations: Oral care Q4 per protocol   Follow Up Recommendations    ? SNF   Frequency and Duration min 2x/week  2 weeks   Pertinent Vitals/Pain Afebrile, decreased - congested upper airway per RN    SLP Swallow Goals     Swallow Study Prior Functional Status   see hhx     General HPI: Anna House is a 79 y.o. female who suffered a witnessed fall while using her walker at home admitted on 2013-02-01.  She struck the front of her head and subsequently had LOC.  After EMS arrived she had 1 episode of vomiting while unconscious but seemed to guard her airway per MD note.  After arrival to the  ED she remained unconscious though with purposeful movements, neurology was curb sided and felt this was likely a complicated concussion.  Pt isn't able to speak very well at baseline due to a longstanding cleft palate.  Swallow evaluation has been ordered but hindered by pt's lethargy.   Type of Study: Bedside swallow evaluation Diet Prior to this Study: NPO Temperature Spikes Noted: No Respiratory Status: Room air History of Recent Intubation: No Behavior/Cognition: Lethargic Oral Cavity - Dentition: Dentures, top;Dentures, bottom Self-Feeding Abilities: Total  assist Patient Positioning: Upright in bed Baseline Vocal Quality: Low vocal intensity Volitional Cough: Cognitively unable to elicit Volitional Swallow: Unable to elicit    Oral/Motor/Sensory Function Overall Oral Motor/Sensory Function:  (? mild decreased movement of right facial musculature, weak, limited by lethargy)   Ice Chips Ice chips: Not tested   Thin Liquid Thin Liquid: Impaired Oral Phase Impairments: Reduced lingual movement/coordination;Impaired anterior to posterior transit;Reduced labial seal;Poor awareness of bolus Oral Phase Functional Implications: Prolonged oral transit;Oral holding Pharyngeal  Phase Impairments: Suspected delayed Swallow;Decreased hyoid-laryngeal movement;Cough - Immediate Other Comments: moisture via toothette    Nectar Thick Nectar Thick Liquid: Not tested   Honey Thick Honey Thick Liquid: Not tested   Puree Puree: Not tested   Solid   GO    Solid: Not tested       Donavan Burnet, MS [facility] SLP [phone]

## 2013-01-06 NOTE — Progress Notes (Signed)
CSW Proofreader) spoke with pt sister on the phone and provided with bed offers. Pt sister hoping that pt will be able to dc to [facility]. If they do not have availability when pt is ready for dc pt sister would like [facility] to be second choice.   Fusae Florio, LCSWA [phone]

## 2013-01-07 LAB — GLUCOSE, CAPILLARY
Glucose-Capillary: 134 mg/dL — ABNORMAL HIGH (ref 70–99)
Glucose-Capillary: 137 mg/dL — ABNORMAL HIGH (ref 70–99)
Glucose-Capillary: 158 mg/dL — ABNORMAL HIGH (ref 70–99)
Glucose-Capillary: 161 mg/dL — ABNORMAL HIGH (ref 70–99)
Glucose-Capillary: 162 mg/dL — ABNORMAL HIGH (ref 70–99)

## 2013-01-07 NOTE — Progress Notes (Signed)
[facility] PROGRESS NOTE  Anna House UVO:[id number] DOB: [birth year] DOA: 12/31/2012 PCP: Colon Branch, MD  Assessment/Plan: #1 acute respiratory failure with hypoxia/aspiration pneumonitis Stable. Chest x-ray was a retrocardiac opacities have a stable and likely scar tissue. Patient currently 99% on 2 L. Titrate O2. Follow.  #2 fall/concussion/left orbital fracture Patient is slightly more alert however still lethargic. CT of the head negative for any acute abnormalities. Patient has been seen by physical therapy recommending skilled nursing facility on discharge. NG tube has been placed and patient has been started on tube feeds. Speech therapy to reassess as patient improves in terms of her alertness.  #3 atrial fibrillation Continue IV Lopressor for rate control. Patient on anticoagulation candidate secondary to history of multiple falls. Once tolerating oral intake will resume home regimen.  #4 hypokalemia Repleted.  #5 left bundle branch block Chronic. No further workup needed at this time.  #6 chronic combined systolic and diastolic CHF EF 74% per echo 2013 Continue home regimen. No further workup needed at this time.  #7 avascular necrosis of the hip Patient with chronic hip pain. Dr. Alesia Banda and discuss with family and patient is not a candidate for hip replacement. Outpatient followup. Pain management.  #8 history of repaired cleft palate NG tube has been placed for feeding as patient is too lethargic to oral intake. Speech therapy to reassess when patient alertness improves.  Code Status: Full Family Communication: No family at bedside. Disposition Plan: SNF when medically stable.   Consultants:  Cardiology: Dr. Dr. Shirlee Latch 01/04/2013  Procedures:  CT head/ CT C-spine /CT maxillofacial 01/16/2013  Abdominal x-ray 01/06/2013  Chest x-ray 01/06/2013, 12/23/2012  X-rays of the pelvis 01/16/2013  NG tube placed  01/06/2013  Antibiotics:  None  HPI/Subjective: Patient more alert today. Still with some lethargy.  Objective: Filed Vitals:   01/07/13 1400  BP: 155/67  Pulse: 81  Temp: 98.2 F (36.8 C)  Resp: 20    Intake/Output Summary (Last 24 hours) at 01/07/13 2009 Last data filed at 01/07/13 1728  Gross per 24 hour  Intake 1956.66 ml  Output      1 ml  Net 1955.66 ml   Filed Weights   01/19/2013 1227 01/06/13 2038 01/07/13 0503  Weight: 54.4 kg (119 lb 14.9 oz) 53.978 kg (119 lb) 55 kg (121 lb 4.1 oz)    Exam:  General:  More alert today, still with some lethargic.L orbital edema has decreased, ecchymosis maturing   Cardiovascular: Irregularly irregular. No JVD. No edema.  Respiratory: Clear to auscultation bilaterally anterior lung fields.  Abdomen: Soft, nontender, nondistended, positive bowel sounds.  Musculoskeletal: No clubbing cyanosis or edema.  Data Reviewed: Basic Metabolic Panel:  Recent Labs Lab 12/29/2012 0345 12/31/2012 1800 01/04/13 0520 01/05/13 0435  NA 140 142 142 142  K 2.9* 4.4 4.1 3.7  CL 98 102 104 103  CO2 27 28 29 28   GLUCOSE 203* 96 84 92  BUN 13 8 8 12   CREATININE 0.93 0.62 0.66 0.59  CALCIUM 9.3 8.9 9.0 9.0   Liver Function Tests:  Recent Labs Lab 12/30/2012 0345 01/04/13 0520  AST 20 15  ALT 8 6  ALKPHOS 83 76  BILITOT 0.3 0.4  PROT 7.5 6.7  ALBUMIN 3.6 3.2*   No results found for this basename: LIPASE, AMYLASE,  in the last 168 hours No results found for this basename: AMMONIA,  in the last 168 hours CBC:  Recent Labs Lab 01/11/2013 0345 01/04/13 0520 01/05/13 0435  WBC 17.4*  9.7 12.4*  NEUTROABS 12.7*  --   --   HGB 12.5 11.3* 12.1  HCT 39.6 37.0 39.4  MCV 92.5 94.1 95.2  PLT 456* 296 279   Cardiac Enzymes:  Recent Labs Lab 01/17/2013 1447  CKTOTAL 50   BNP (last 3 results)  Recent Labs  2013-01-17 0345  PROBNP 5356.0*   CBG:  Recent Labs Lab 01/07/13 0502 01/07/13 0806 01/07/13 1151 01/07/13 1720  01/07/13 1959  GLUCAP 158* 162* 134* 150* 137*    Recent Results (from the past 240 hour(s))  MRSA PCR SCREENING     Status: None   Collection Time    17-Jan-2013  1:56 PM      Result Value Range Status   MRSA by PCR NEGATIVE  NEGATIVE Final   Comment:            The GeneXpert MRSA Assay (FDA     approved for NASAL specimens     only), is one component of a     comprehensive MRSA colonization     surveillance program. It is not     intended to diagnose MRSA     infection nor to guide or     monitor treatment for     MRSA infections.  URINE CULTURE     Status: None   Collection Time    17-Jan-2013  2:20 PM      Result Value Range Status   Specimen Description URINE, CATHETERIZED   Final   Special Requests NONE   Final   Culture  Setup Time     Final   Value: 01-17-2013 14:39     Performed at [facility]   Colony Count     Final   Value: NO GROWTH     Performed at [facility]   Culture     Final   Value: NO GROWTH     Performed at [facility]   Report Status 01/04/2013 FINAL   Final     Studies: Dg Chest Port 1 View  01/06/2013   CLINICAL DATA:  Evaluate retrocardiac infiltrate  EXAM: PORTABLE CHEST - 1 VIEW  COMPARISON:  January 17, 2013; 12/28/2012; 12/09/2012 ; 03/04/2010  FINDINGS: Grossly unchanged enlarged cardiac silhouette and mediastinal contours with atherosclerotic plaque within the thoracic aorta. Lung volumes remain reduced. Grossly unchanged left basilar/retrocardiac heterogeneous/consolidative opacities. Trace left-sided effusion is not excluded. There is grossly unchanged mild diffuse slightly nodular thickening of the pulmonary interstitium. No pneumothorax. No definite evidence of edema. Grossly unchanged bones.  IMPRESSION: 1. Persistent findings of chronic pulmonary venous congestion without definite acute cardiopulmonary disease on this [facility] portable examination. 2. Grossly unchanged findings of a cardiomegaly, hypoventilation and left  basilar/ retrocardiac opacities, stable since the 02/2010 examination and thus favored to represent atelectasis or scar.   Electronically Signed   By: Simonne Come M.D.   On: 01/06/2013 08:00   Dg Abd Portable 1v  01/06/2013   CLINICAL DATA:  Feeding tube placement.  EXAM: PORTABLE ABDOMEN - 1 VIEW  COMPARISON:  None.  FINDINGS: Weighted tip feeding tube is present with the tip at the ligament of Treitz. Cholecystectomy clips are present in the right upper quadrant. Bowel gas pattern appears within normal limits.  IMPRESSION: Post pyloric feeding tube position with the tip at the ligament of Treitz.   Electronically Signed   By: Andreas Newport M.D.   On: 01/06/2013 14:51    Scheduled Meds: . levothyroxine  50 mcg Intravenous QAC breakfast  .  metoprolol  5 mg Intravenous Q6H  . sodium chloride  3 mL Intravenous Q12H   Continuous Infusions: . dextrose 5 % and 0.9% NaCl 50 mL/hr at 01/06/13 1408  . feeding supplement (JEVITY 1.2 CAL) 1,000 mL (01/07/13 1728)    Principal Problem:   Acute respiratory failure with hypoxia Active Problems:   Atrial fibrillation   Left bundle branch block   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 25% per ECHO 2013   Left orbit fracture   Fall at home   Aspiration pneumonitis   Concussion with 1-24 hours loss of consciousness   Hypokalemia   Avascular necrosis of bone of hip    Time spent: 30 minutes    Chele Cornell M.D. [facility] Pager [phone]. If 7PM-7AM, please contact night-coverage at [email or web address], password [facility] 01/07/2013, 8:09 PM  LOS: 4 days

## 2013-01-07 NOTE — Progress Notes (Signed)
CSW re-faxed FL2 to facilities with updated information on feeding tube. CSW awaiting bed offers. Patient's family has a preference for [facility]. Second choice is [facility]. Weekend CSW will follow up with patient's family.  Maree Krabbe, MSW, [facility]ors [phone]

## 2013-01-07 NOTE — Progress Notes (Signed)
{  SLP ALL NOTES:1SLP Cancellation Note  Patient Details Name: Anna House MRN: [id number] DOB: October 27, 1933   Cancelled treatment:       Reason Eval/Treat Not Completed: Fatigue/lethargy limiting ability to participate  Pt now has feeding tube for nutrition, will re-attempt evaluation as pt demonstrates improved LOA.  Thanks.   Donavan Burnet, MS [facility] SLP [phone]

## 2013-01-08 ENCOUNTER — Inpatient Hospital Stay (HOSPITAL_COMMUNITY): Payer: Medicare Other

## 2013-01-08 DIAGNOSIS — G9340 Encephalopathy, unspecified: Secondary | ICD-10-CM | POA: Diagnosis present

## 2013-01-08 DIAGNOSIS — E870 Hyperosmolality and hypernatremia: Secondary | ICD-10-CM | POA: Diagnosis not present

## 2013-01-08 DIAGNOSIS — I6350 Cerebral infarction due to unspecified occlusion or stenosis of unspecified cerebral artery: Secondary | ICD-10-CM

## 2013-01-08 DIAGNOSIS — J9600 Acute respiratory failure, unspecified whether with hypoxia or hypercapnia: Secondary | ICD-10-CM

## 2013-01-08 DIAGNOSIS — S0280XA Fracture of other specified skull and facial bones, unspecified side, initial encounter for closed fracture: Secondary | ICD-10-CM

## 2013-01-08 LAB — BASIC METABOLIC PANEL
BUN: 18 mg/dL (ref 6–23)
Calcium: 9.1 mg/dL (ref 8.4–10.5)
Chloride: 112 mEq/L (ref 96–112)
Creatinine, Ser: 0.58 mg/dL (ref 0.50–1.10)
GFR calc Af Amer: >90 mL/min (ref >90)
GFR calc non Af Amer: 87 mL/min — ABNORMAL LOW (ref >90)
Potassium: 3.8 mEq/L (ref 3.5–5.1)

## 2013-01-08 LAB — GLUCOSE, CAPILLARY
Glucose-Capillary: 154 mg/dL — ABNORMAL HIGH (ref 70–99)
Glucose-Capillary: 157 mg/dL — ABNORMAL HIGH (ref 70–99)

## 2013-01-08 MED ORDER — DEXTROSE 5 % IV SOLN
INTRAVENOUS | Status: DC
  Administered 2013-01-08: 09:00:00 via INTRAVENOUS
  Filled 2013-01-08: qty 1000

## 2013-01-08 MED ORDER — DEXTROSE 5 % IV SOLN: INTRAVENOUS | Status: DC

## 2013-01-08 NOTE — Consult Note (Addendum)
Reason for Consult: Unresponsiveness Referring Physician: Dr.Thompson  CC: The patient is nonverbal - History obtained from chart.  HPI: Anna House is a 79 y.o. female who experienced a witnessed fall while ambulating with her walker at home. The patient struck the front of her head with loss of consciousness. EMS was summoned and the patient was brought to the emergency department. Apparently she had an episode of vomiting while semi conscious. She woke up briefly while in the emergency room and was noted to be partially oriented with the ability to follow some commands. A CT scan showed no acute intracranial injury. She was noted to have a left ORBIT fracture as a result of the fall. Over the next several days she remained lethargic although she was oriented and was able to move all of her extremities.  According to his chart there were no obvious neurologic deficits. She developed respiratory distress and was felt to have aspiration pneumonia. A feeding tube was placed. Yesterday she was noted to be more alert but still lethargic. Today a neurology consult was requested. An MRI was also ordered which is pending. By my exam the patient at this time is unresponsive except for minimal lower extremity withdrawal to deep pain.  Past Medical History  Diagnosis Date  . Essential hypertension, benign   . PAF (paroxysmal atrial fibrillation)     a. on amiodarone (dose reduced to 100mg  daily 06/2012);  b. Not felt to be anticoagulation candidate.  . Hypothyroidism   . Left bundle branch block   . Cleft palate   . Cardiomyopathy     a. 2012 Cardiolite: small inferoapical ischemic defect-> conservative mgmt;  b. 08/2011 Echo: EF 20-25%, multiple wma's, diastolic dysfxn, nl valves, nl LA size.  . Pulmonary hypertension   . Frequent falls   . Concussion     a. 12/2012  . Left Orbital Fracture     a. 12/2012    Past Surgical History  Procedure Laterality Date  . Colonoscopy  01/09/2011     Procedure: COLONOSCOPY;  Surgeon: Malissa Hippo, MD;  Location[facility];  Service: Endoscopy;  Laterality: N/A;  2:00 pm    Family history: Unable to obtain secondary to patient mental status  Social History:  reports that she has never smoked. She has never used smokeless tobacco. She reports that she does not drink alcohol or use illicit drugs.  Allergies  Allergen Reactions  . Penicillins     Medications:  Scheduled: . levothyroxine  50 mcg Intravenous QAC breakfast  . metoprolol  5 mg Intravenous Q6H  . sodium chloride  3 mL Intravenous Q12H    ROS: Unobtainable this time.  Physical Examination: Blood pressure 149/94, pulse 116, temperature 99.2 F (37.3 C), temperature source Oral, resp. rate 28, height 5\' 3"  (1.6 m), weight 54.3 kg (119 lb 11.4 oz), SpO2 96.00%.  Neurologic Examination  Mental Status: The patient is lying in bed with eyes closed. She does not respond to voice. With deep sternal rub the patient groans and localizes to pain with both upper extremities.  She follows no commands. Cranial Nerves: II: Discs flat bilaterally.  Pupils 3mm, round and briskly reactive bilaterally. There is no blink to threat bilaterally. The left orbit is ecchymotic. III,IV, VI: No response to oculocephalic maneuvers V,VII: Corneals intact bilaterally VIII: Could not be tested IX,X: gag reflex is absent XI: Shoulder shrug could not be tested XII: The patient does not protrude her tongue Motor: Patient lifts both upper extremities  off bed in response to pain.  Movement noted in the right lower extremity as well.  Only minimal LLE movement noted.   Sensory: Patient responds to noxious stimuli in all extremities.   Deep Tendon Reflexes: 2+ in the upper extremities, 1+ at the knees and absent at ankles Plantars: Right: Upgoing   Left: Upgoing Cerebellar: Could not be tested Gait: Could not be tested   Laboratory Studies:   Basic Metabolic Panel:  Recent Labs Lab  01/05/2013 0345 12/24/2012 1800 01/04/13 0520 01/05/13 0435 01/08/13 0535  NA 140 142 142 142 152*  K 2.9* 4.4 4.1 3.7 3.8  CL 98 102 104 103 112  CO2 27 28 29 28  33*  GLUCOSE 203* 96 84 92 164*  BUN 13 8 8 12 18   CREATININE 0.93 0.62 0.66 0.59 0.58  CALCIUM 9.3 8.9 9.0 9.0 9.1    Liver Function Tests:  Recent Labs Lab 01/02/2013 0345 01/04/13 0520  AST 20 15  ALT 8 6  ALKPHOS 83 76  BILITOT 0.3 0.4  PROT 7.5 6.7  ALBUMIN 3.6 3.2*   No results found for this basename: LIPASE, AMYLASE,  in the last 168 hours No results found for this basename: AMMONIA,  in the last 168 hours  CBC:  Recent Labs Lab 01/14/2013 0345 01/04/13 0520 01/05/13 0435  WBC 17.4* 9.7 12.4*  NEUTROABS 12.7*  --   --   HGB 12.5 11.3* 12.1  HCT 39.6 37.0 39.4  MCV 92.5 94.1 95.2  PLT 456* 296 279    Cardiac Enzymes:  Recent Labs Lab 01/15/2013 1447  CKTOTAL 50    BNP: No components found with this basename: POCBNP,   CBG:  Recent Labs Lab 01/07/13 1959 01/07/13 2353 01/08/13 0400 01/08/13 0728 01/08/13 1139  GLUCAP 137* 161* 156* 154* 170*    Microbiology: Results for orders placed during the hospital encounter of 01/04/2013  MRSA PCR SCREENING     Status: None   Collection Time    01/06/2013  1:56 PM      Result Value Range Status   MRSA by PCR NEGATIVE  NEGATIVE Final   Comment:            The GeneXpert MRSA Assay (FDA     approved for NASAL specimens     only), is one component of a     comprehensive MRSA colonization     surveillance program. It is not     intended to diagnose MRSA     infection nor to guide or     monitor treatment for     MRSA infections.  URINE CULTURE     Status: None   Collection Time    12/30/2012  2:20 PM      Result Value Range Status   Specimen Description URINE, CATHETERIZED   Final   Special Requests NONE   Final   Culture  Setup Time     Final   Value: 01/07/2013 14:39     Performed at [facility] Count     Final    Value: NO GROWTH     Performed at [facility]   Culture     Final   Value: NO GROWTH     Performed at [facility]   Report Status 01/04/2013 FINAL   Final    Coagulation Studies: No results found for this basename: LABPROT, INR,  in the last 72 hours  Urinalysis:  Recent Labs Lab 01/17/2013 1420  COLORURINE YELLOW  LABSPEC 1.015  PHURINE 7.0  GLUCOSEU NEGATIVE  HGBUR NEGATIVE  BILIRUBINUR NEGATIVE  KETONESUR NEGATIVE  PROTEINUR 100*  UROBILINOGEN 0.2  NITRITE NEGATIVE  LEUKOCYTESUR NEGATIVE    Lipid Panel:  No results found for this basename: chol, trig, hdl, cholhdl, vldl, ldlcalc    HgbA1C:  No results found for this basename: HGBA1C    Urine Drug Screen:   No results found for this basename: labopia, cocainscrnur, labbenz, amphetmu, thcu, labbarb    Alcohol Level: No results found for this basename: ETH,  in the last 168 hours  Other results: EKG: Atrial fibrillation. Ventricular response 82 beats per minute.  Imaging:  Dg Abd Portable 1v 01/06/2013    Post pyloric feeding tube position with the tip at the ligament of Treitz.     CT of head and cervical spine without contrast 01/13/2013  1. No evidence of acute intracranial injury.  2. Negative for acute cervical spine fracture.  3. Left orbital floor fracture with thin extraconal hemorrhage that  does not cause proptosis.  4. Nondisplaced inferior left maxillary sinus fractures.  Patient seen and examined.  Clinical course and management discussed.  Necessary edits performed.  I agree with the above.  Assessment and plan of care developed and discussed below.   Assessment/Plan: 79 year old female presenting after a fall.  Has not returned to baseline since the fall and actually has been minimally responsive for the most past.  Initial head CT has been reviewed and shows no acute changes other than the fracture.  On no medications of concern.  Patient afebrile although white blood cell  count elevated.   Patient has a history of PAF.  Can not rule out the possibility of stroke versus seizure.    Recommendations: 1.  MRI of the brain without contrast 2.  EEG    Thana Farr, MD [facility] [phone]  01/08/2013  4:41 PM

## 2013-01-08 NOTE — Progress Notes (Signed)
[facility] PROGRESS NOTE  Anna House NWG:[id number] DOB: 04-27-1933 DOA: 01/16/2013 PCP: Colon Branch, MD  Assessment/Plan: #1 acute encephalopathy Questionable etiology.initially felt to be likely secondary to a concussion. However patient has been lethargic and minimally responsive since admission. CT of the head which was done was negative. Chest x-ray was negative for any acute infiltrate. Will get MRI of the head. Will consult with neurology for further evaluation and management.  #2 acute respiratory failure with hypoxia/aspiration pneumonitis Stable. Chest x-ray was a retrocardiac opacities have a stable and likely scar tissue. Patient currently 99% on 2 L. Titrate O2. Follow.  #2 fall/concussion/left orbital fracture Patient is still lethargic. CT of the head negative for any acute abnormalities. Patient has been seen by physical therapy recommending skilled nursing facility on discharge. NG tube has been placed and patient has been started on tube feeds. Speech therapy to reassess as patient improves in terms of her alertness. Check MRI of the head. Consult with neurology.  #3 atrial fibrillation Continue IV Lopressor for rate control. Patient not on anticoagulation candidate secondary to history of multiple falls. Once tolerating oral intake will resume home regimen.  #4 hypokalemia Repleted.  #5 left bundle branch block Chronic. No further workup needed at this time.  #6 chronic combined systolic and diastolic CHF EF 57% per echo 2013 Continue home regimen. No further workup needed at this time.  #7 avascular necrosis of the hip Patient with chronic hip pain. Dr. Butler Denmark discussed with family and patient is not a candidate for hip replacement. Outpatient followup. Pain management.  #8 history of repaired cleft palate NG tube has been placed for feeding as patient is too lethargic to oral intake. Speech therapy to reassess when patient alertness improves.  Code  Status: Full Family Communication: updated sisters at bedside. Disposition Plan: SNF when medically stable.   Consultants:  Cardiology: Dr. Dr. Shirlee Latch 01/04/2013  Procedures:  CT head/ CT C-spine /CT maxillofacial 01/13/2013  Abdominal x-ray 01/06/2013  Chest x-ray 01/06/2013, 01/18/2013  X-rays of the pelvis 01/03/2013  NG tube placed 01/06/2013  Antibiotics:  None  HPI/Subjective: Patient lethargic. Minimally responsive to noxious stimuli.  Objective: Filed Vitals:   01/08/13 1430  BP: 152/92  Pulse: 87  Temp: 98.1 F (36.7 C)  Resp: 20    Intake/Output Summary (Last 24 hours) at 01/08/13 1837 Last data filed at 01/08/13 1500  Gross per 24 hour  Intake 1605.84 ml  Output      0 ml  Net 1605.84 ml   Filed Weights   01/06/13 2038 01/07/13 0503 01/08/13 0639  Weight: 53.978 kg (119 lb) 55 kg (121 lb 4.1 oz) 54.3 kg (119 lb 11.4 oz)    Exam:  General:  Patient is lethargic with minimal response to noxious stimuli. L orbital edema has decreased, ecchymosis maturing   Cardiovascular: Irregularly irregular. No JVD. No edema.  Respiratory: Clear to auscultation bilaterally anterior lung fields.  Abdomen: Soft, nontender, nondistended, positive bowel sounds.  Musculoskeletal: No clubbing cyanosis or edema.  Data Reviewed: Basic Metabolic Panel:  Recent Labs Lab 01/03/2013 0345 01/09/2013 1800 01/04/13 0520 01/05/13 0435 01/08/13 0535  NA 140 142 142 142 152*  K 2.9* 4.4 4.1 3.7 3.8  CL 98 102 104 103 112  CO2 27 28 29 28  33*  GLUCOSE 203* 96 84 92 164*  BUN 13 8 8 12 18   CREATININE 0.93 0.62 0.66 0.59 0.58  CALCIUM 9.3 8.9 9.0 9.0 9.1   Liver Function Tests:  Recent Labs  Lab 2013/01/23 0345 01/04/13 0520  AST 20 15  ALT 8 6  ALKPHOS 83 76  BILITOT 0.3 0.4  PROT 7.5 6.7  ALBUMIN 3.6 3.2*   No results found for this basename: LIPASE, AMYLASE,  in the last 168 hours No results found for this basename: AMMONIA,  in the last 168  hours CBC:  Recent Labs Lab 01-23-13 0345 01/04/13 0520 01/05/13 0435  WBC 17.4* 9.7 12.4*  NEUTROABS 12.7*  --   --   HGB 12.5 11.3* 12.1  HCT 39.6 37.0 39.4  MCV 92.5 94.1 95.2  PLT 456* 296 279   Cardiac Enzymes:  Recent Labs Lab Jan 23, 2013 1447  CKTOTAL 50   BNP (last 3 results)  Recent Labs  01/23/13 0345  PROBNP 5356.0*   CBG:  Recent Labs Lab 01/07/13 1959 01/07/13 2353 01/08/13 0400 01/08/13 0728 01/08/13 1139  GLUCAP 137* 161* 156* 154* 170*    Recent Results (from the past 240 hour(s))  MRSA PCR SCREENING     Status: None   Collection Time    Jan 23, 2013  1:56 PM      Result Value Range Status   MRSA by PCR NEGATIVE  NEGATIVE Final   Comment:            The GeneXpert MRSA Assay (FDA     approved for NASAL specimens     only), is one component of a     comprehensive MRSA colonization     surveillance program. It is not     intended to diagnose MRSA     infection nor to guide or     monitor treatment for     MRSA infections.  URINE CULTURE     Status: None   Collection Time    01/23/13  2:20 PM      Result Value Range Status   Specimen Description URINE, CATHETERIZED   Final   Special Requests NONE   Final   Culture  Setup Time     Final   Value: 2013-01-23 14:39     Performed at [facility] Count     Final   Value: NO GROWTH     Performed at [facility]   Culture     Final   Value: NO GROWTH     Performed at [facility]   Report Status 01/04/2013 FINAL   Final     Studies: No results found.  Scheduled Meds: . levothyroxine  50 mcg Intravenous QAC breakfast  . metoprolol  5 mg Intravenous Q6H  . sodium chloride  3 mL Intravenous Q12H   Continuous Infusions: . dextrose    . feeding supplement (JEVITY 1.2 CAL) 1,000 mL (01/07/13 1728)    Principal Problem:   Acute encephalopathy Active Problems:   Atrial fibrillation   Left bundle branch block   Chronic combined systolic and diastolic  heart failure/EF 25% per ECHO 2013   Left orbit fracture   Fall at home   Aspiration pneumonitis   Concussion with 1-24 hours loss of consciousness   Acute respiratory failure with hypoxia   Hypokalemia   Avascular necrosis of bone of hip   Hypernatremia    Time spent: 30 minutes    THOMPSON,DANIEL M.D. [facility] Pager [phone]. If 7PM-7AM, please contact night-coverage at [email or web address], password [facility] 01/08/2013, 6:37 PM  LOS: 5 days

## 2013-01-08 NOTE — Progress Notes (Signed)
Hospitalist on called notified of MRI results.

## 2013-01-08 NOTE — Progress Notes (Signed)
SLP Cancellation Note  Patient Details Name: Anna House MRN: [id number] DOB: August 23, 1933   Cancelled treatment:  F/u to reassess swallow.  Diagnostic treatment not completed secondary to continued lethargy with inability to participate fully.  ST to continue attempts.   Moreen Fowler MS, CCC-SLP [phone] [facility] 01/08/2013, 2:39 PM

## 2013-01-08 NOTE — Progress Notes (Signed)
Got a call from hospitalist on call for [facility] regarding brain MRI results. 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 Patient with poor mental status for the last couple of days. I suggested consulting neurosurgery and [facility] hospitalist stated that she will inform and consult neurosurgery as soon as possible.  Wyatt Portela, MD [facility]

## 2013-01-09 ENCOUNTER — Inpatient Hospital Stay (HOSPITAL_COMMUNITY): Payer: Medicare Other

## 2013-01-09 DIAGNOSIS — G9340 Encephalopathy, unspecified: Secondary | ICD-10-CM

## 2013-01-09 DIAGNOSIS — R4182 Altered mental status, unspecified: Secondary | ICD-10-CM

## 2013-01-09 DIAGNOSIS — E870 Hyperosmolality and hypernatremia: Secondary | ICD-10-CM

## 2013-01-09 DIAGNOSIS — J9600 Acute respiratory failure, unspecified whether with hypoxia or hypercapnia: Secondary | ICD-10-CM

## 2013-01-09 DIAGNOSIS — I619 Nontraumatic intracerebral hemorrhage, unspecified: Secondary | ICD-10-CM

## 2013-01-09 DIAGNOSIS — I6350 Cerebral infarction due to unspecified occlusion or stenosis of unspecified cerebral artery: Secondary | ICD-10-CM

## 2013-01-09 DIAGNOSIS — I639 Cerebral infarction, unspecified: Secondary | ICD-10-CM | POA: Diagnosis present

## 2013-01-09 LAB — CBC
HCT: 35.3 % — ABNORMAL LOW (ref 36.0–46.0)
Hemoglobin: 10.5 g/dL — ABNORMAL LOW (ref 12.0–15.0)
MCH: 28.6 pg (ref 26.0–34.0)
MCHC: 29.7 g/dL — ABNORMAL LOW (ref 30.0–36.0)
MCV: 96.2 fL (ref 78.0–100.0)
Platelets: 244 10*3/uL (ref 150–400)
RBC: 3.67 MIL/uL — ABNORMAL LOW (ref 3.87–5.11)
WBC: 8.3 10*3/uL (ref 4.0–10.5)

## 2013-01-09 LAB — BASIC METABOLIC PANEL
BUN: 22 mg/dL (ref 6–23)
CO2: 33 mEq/L — ABNORMAL HIGH (ref 19–32)
Chloride: 112 mEq/L (ref 96–112)
GFR calc Af Amer: >90 mL/min (ref >90)
Glucose, Bld: 145 mg/dL — ABNORMAL HIGH (ref 70–99)
Potassium: 3.7 mEq/L (ref 3.5–5.1)
Sodium: 151 mEq/L — ABNORMAL HIGH (ref 135–145)

## 2013-01-09 LAB — POCT I-STAT 3, ART BLOOD GAS (G3+)
Acid-Base Excess: 10 mmol/L — ABNORMAL HIGH (ref 0.0–2.0)
O2 Saturation: 95 %
TCO2: 35 mmol/L (ref 0–100)

## 2013-01-09 LAB — SODIUM: Sodium: 153 mEq/L — ABNORMAL HIGH (ref 135–145)

## 2013-01-09 LAB — LIPID PANEL
Cholesterol: 170 mg/dL (ref 0–200)
HDL: 52 mg/dL (ref >39)
LDL Cholesterol: 94 mg/dL (ref 0–99)
Total CHOL/HDL Ratio: 3.3 RATIO
Triglycerides: 122 mg/dL (ref <150)
VLDL: 24 mg/dL (ref 0–40)

## 2013-01-09 LAB — GLUCOSE, CAPILLARY: Glucose-Capillary: 132 mg/dL — ABNORMAL HIGH (ref 70–99)

## 2013-01-09 LAB — PROTIME-INR
INR: 1.23 (ref 0.00–1.49)
Prothrombin Time: 15.2 seconds (ref 11.6–15.2)

## 2013-01-09 MED ORDER — DILTIAZEM HCL 100 MG IV SOLR
5.0000 mg/h | INTRAVENOUS | Status: DC
  Administered 2013-01-09: 5 mg/h via INTRAVENOUS
  Administered 2013-01-10: 10 mg/h via INTRAVENOUS
  Filled 2013-01-09 (×3): qty 100

## 2013-01-09 MED ORDER — SODIUM CHLORIDE 3 % IV SOLN
INTRAVENOUS | Status: DC
  Administered 2013-01-09: 50 mL/h via INTRAVENOUS
  Administered 2013-01-10: 03:00:00 via INTRAVENOUS
  Filled 2013-01-09 (×9): qty 500

## 2013-01-09 MED ORDER — SODIUM CHLORIDE 0.9 % IV SOLN
INTRAVENOUS | Status: DC
  Administered 2013-01-09: 09:00:00 via INTRAVENOUS
  Filled 2013-01-09: qty 1000

## 2013-01-09 MED ORDER — DILTIAZEM 12 MG/ML ORAL SUSPENSION
60.0000 mg | Freq: Four times a day (QID) | ORAL | Status: DC
  Filled 2013-01-09 (×4): qty 6

## 2013-01-09 MED ORDER — CHLORHEXIDINE GLUCONATE 0.12 % MT SOLN
15.0000 mL | Freq: Two times a day (BID) | OROMUCOSAL | Status: DC
  Administered 2013-01-09 – 2013-01-12 (×6): 15 mL via OROMUCOSAL
  Filled 2013-01-09 (×6): qty 15

## 2013-01-09 MED ORDER — DILTIAZEM LOAD VIA INFUSION
10.0000 mg | Freq: Once | INTRAVENOUS | Status: AC
  Administered 2013-01-09: 10 mg via INTRAVENOUS
  Filled 2013-01-09: qty 10

## 2013-01-09 MED ORDER — LEVOTHYROXINE SODIUM 100 MCG PO TABS
100.0000 ug | ORAL_TABLET | Freq: Every day | ORAL | Status: DC
  Administered 2013-01-10 – 2013-01-12 (×3): 100 ug
  Filled 2013-01-09 (×4): qty 1

## 2013-01-09 MED ORDER — PANTOPRAZOLE SODIUM 40 MG PO PACK
40.0000 mg | PACK | Freq: Every day | ORAL | Status: DC
  Administered 2013-01-10 – 2013-01-12 (×3): 40 mg
  Filled 2013-01-09 (×4): qty 20

## 2013-01-09 MED ORDER — BIOTENE DRY MOUTH MT LIQD
15.0000 mL | Freq: Two times a day (BID) | OROMUCOSAL | Status: DC
  Administered 2013-01-10 – 2013-01-12 (×5): 15 mL via OROMUCOSAL

## 2013-01-09 MED ORDER — WHITE PETROLATUM GEL
Status: AC
  Filled 2013-01-09: qty 5

## 2013-01-09 MED ORDER — INSULIN ASPART 100 UNIT/ML ~~LOC~~ SOLN
1.0000 [IU] | SUBCUTANEOUS | Status: DC
  Administered 2013-01-09 – 2013-01-10 (×7): 1 [IU] via SUBCUTANEOUS
  Administered 2013-01-11: 2 [IU] via SUBCUTANEOUS
  Administered 2013-01-11 (×2): 1 [IU] via SUBCUTANEOUS
  Administered 2013-01-11: 2 [IU] via SUBCUTANEOUS
  Administered 2013-01-11 (×2): 1 [IU] via SUBCUTANEOUS
  Administered 2013-01-12 (×2): 2 [IU] via SUBCUTANEOUS

## 2013-01-09 NOTE — Consult Note (Signed)
Name: Anna House MRN: [id number] DOB: 09-08-1933    ADMISSION DATE:  01/21/13 CONSULTATION DATE:  01/09/13  REFERRING MD :  [facility]  PRIMARY SERVICE: PCCM   CHIEF COMPLAINT:    BRIEF PATIENT DESCRIPTION: 79 yo WF multiple medical comorbidities, including hypertension, atrial fibrillation, cardiomyopathy (EF 20-25%), and pulmonary hypertension, admitted 12/15 after having fallen at home with altered mental status. Worsening mental status with MRI brain showing a right SCA CVA. That CVA, which at admission was not hemorrhagic, has evolved into a hemorrhagic CVA complicated by hypernatremia . Pt transferred to ICU , PCCM to assume care 12/21.   SIGNIFICANT EVENTS / STUDIES:  MRI 12/20 Hemorrhagic infarct involving the superior right cerebellum with mass effect on the 4th ventricle but no hydrocephalus. . Multiple punctate areas susceptibility compatible with remote  blood products suggest a diffuse vasculitis, specifically amyloid angiopathy.. Diffuse atrophy and white matter disease. This likely reflects the sequela of chronic microvascular ischemia.   Posttraumatic changes of the left maxilla are again noted. 01/09/13 Transfer to NICU for CVA with worsening mentation  12/21 Carotid Dopplers 1-39% stenosis , vertebral patent   LINES / TUBES: 12/21 IJ >>  CULTURES:  ANTIBIOTICS:  HISTORY OF PRESENT ILLNESS:   Patient is an elderly female who presented January 21, 2013 from home with decreased level of consciousness and responsiveness. She had apparently fallen at home. Patient was evaluated in the emergency room   CT scan of the head was interpreted as "No evidence of acute abnormality, such as acute infarction, hemorrhage, hydrocephalus, or mass lesion/mass effect.  Patient was subsequently admitted to the [facility]ad hospitalist service and has continued under their care since admission. Because of persistent altered mental status Neuro was consulted on 12/20 , MRI of the brain was performed on   This study showed evidence of a right superior cerebellar hemorrhagic CVA. Neurology was notified of the results, and recommended neurosurgical consultation.   The MRI shows a right superior cerebellar artery distribution CVA, that is currently hemorrhagic, but in retrospect an area of decreased density was seen in the right SCA distribution on the admission CT and therefore the patient in fact presented with a nonhemorrhagic right SCA CVA, which has now become a hemorrhagic right SCA CVA. The admission CT showed no mass effect on the fourth ventricle, however the MRI scan last night does show some mass effect on the brain stem and fourth ventricle, but no evidence of hydrocephalus, but the CT and the MRI show significant generalized cerebral atrophy. NS consultation feels pt is not a surgical candidate due to  There is no hydrocephalus, and therefore no indication for placement of the IVC.  Surgical intervention would not improve patient's prognosis for neurologic recovery. Such a heroic intervention would be fraught with significant risk of complications, and the overall prognosis for survival and recovery would not be enhanced. Pt was moved to ICU with PCCM consult to assume care per protocol. Pt is agitated and does not follow commands. HR is tachycardic ~130-140. B/P is elevated.  She is incontinent.  No family is present     PAST MEDICAL HISTORY :  Past Medical History  Diagnosis Date  . Essential hypertension, benign   . PAF (paroxysmal atrial fibrillation)     a. on amiodarone (dose reduced to 100mg  daily 06/2012);  b. Not felt to be anticoagulation candidate.  . Hypothyroidism   . Left bundle branch block   . Cleft palate   . Cardiomyopathy     a.  2012 Cardiolite: small inferoapical ischemic defect-> conservative mgmt;  b. 08/2011 Echo: EF 20-25%, multiple wma's, diastolic dysfxn, nl valves, nl LA size.  . Pulmonary hypertension   . Frequent falls   . Concussion     a. 12/2012  . Left  Orbital Fracture     a. 12/2012   Past Surgical History  Procedure Laterality Date  . Colonoscopy  01/09/2011    Procedure: COLONOSCOPY;  Surgeon: Malissa Hippo, MD;  Location: [facility];  Service: Endoscopy;  Laterality: N/A;  2:00 pm   Prior to Admission medications   Medication Sig Start Date End Date Taking? Authorizing Provider  acetaminophen (TYLENOL) 500 MG tablet Take 1,000 mg by mouth 2 (two) times daily.   Yes Historical Provider, MD  amLODipine (NORVASC) 5 MG tablet Take 1 tablet (5 mg total) by mouth daily. 09/09/11  Yes June Leap, MD  digoxin (LANOXIN) 0.25 MG tablet Take 0.25 mg by mouth daily.   Yes Historical Provider, MD  diltiazem (CARDIZEM) 30 MG tablet Take 2 tablets by mouth 3 (three) times daily. 12/31/12  Yes Historical Provider, MD  furosemide (LASIX) 40 MG tablet Take 40 mg by mouth 2 (two) times daily.    Yes Historical Provider, MD  levothyroxine (SYNTHROID, LEVOTHROID) 100 MCG tablet Take 100 mcg by mouth daily before breakfast.   Yes Historical Provider, MD  metoprolol tartrate (LOPRESSOR) 25 MG tablet Take 12.5 mg by mouth 2 (two) times daily.   Yes Historical Provider, MD  potassium chloride (K-DUR) 10 MEQ tablet Take 1 tablet by mouth daily. 12/31/12  Yes Historical Provider, MD  quinapril (ACCUPRIL) 10 MG tablet Take 10 mg by mouth daily.   Yes Historical Provider, MD  quinapril (ACCUPRIL) 20 MG tablet Take 1 tablet by mouth Daily. 09/03/11  Yes Historical Provider, MD  traMADol (ULTRAM) 50 MG tablet Take 1 tablet by mouth 2 (two) times daily. 04/01/12  Yes Historical Provider, MD  Vitamin D, Ergocalciferol, (DRISDOL) 50000 UNITS CAPS capsule Take 50,000 Units by mouth every 7 (seven) days.   Yes Historical Provider, MD  amiodarone (PACERONE) 200 MG tablet Take 0.5 tablets (100 mg total) by mouth daily. 07/02/12   Jonelle Sidle, MD  labetalol (NORMODYNE) 200 MG tablet Take 600 mg by mouth 2 (two) times daily.    Historical Provider, MD   Allergies    Allergen Reactions  . Penicillins     FAMILY HISTORY:  History reviewed. No pertinent family history. SOCIAL HISTORY:  reports that she has never smoked. She has never used smokeless tobacco. She reports that she does not drink alcohol or use illicit drugs.  REVIEW OF SYSTEMS:  Reviewed in detail from chart , unable to obtain from pt as she is confused   SUBJECTIVE:  Altered mental status   VITAL SIGNS: Temp:  [97.6 F (36.4 C)-98.2 F (36.8 C)] 97.6 F (36.4 C) (12/21 1138) Pulse Rate:  [87-114] 100 (12/21 1005) Resp:  [20-29] 28 (12/21 1005) BP: (128-156)/(89-120) 128/91 mmHg (12/21 1005) SpO2:  [98 %-99 %] 98 % (12/21 1005) Weight:  [55.4 kg (122 lb 2.2 oz)-56.6 kg (124 lb 12.5 oz)] 56.6 kg (124 lb 12.5 oz) (12/21 1000) HEMODYNAMICS:   VENTILATOR SETTINGS:   INTAKE / OUTPUT: Intake/Output     12/20 0701 - 12/21 0700 12/21 0701 - 12/22 0700   I.V. (mL/kg) 1347 (24.3) 72.5 (1.3)   NG/GT 932 950   Total Intake(mL/kg) 2279 (41.1) 1022.5 (18.1)   Urine (mL/kg/hr)  Total Output       Net +2279 +1022.5        Urine Occurrence 4 x    Stool Occurrence       PHYSICAL EXAMINATION: General:  Elderly , frail , agitated  Neuro:  Agitated, confused, does not follow commands Pupil 3 mm reactive , gag present  HEENT:  Dry mucosa  Cardiovascular:  ST , no m/r/g  Lungs:  Diminshed BS in bases  Abdomen:  Soft , NT , BS hypoactive  Musculoskeletal:  Intact , moves all extremities  Skin:  Intact   LABS:  CBC  Recent Labs Lab 01/04/13 0520 01/05/13 0435 01/09/13 0510  WBC 9.7 12.4* 8.3  HGB 11.3* 12.1 10.5*  HCT 37.0 39.4 35.3*  PLT 296 279 244   Coag's  Recent Labs Lab 01/11/2013 0345  APTT 30  INR 0.99   BMET  Recent Labs Lab 01/05/13 0435 01/08/13 0535 01/09/13 0510  NA 142 152* 151*  K 3.7 3.8 3.7  CL 103 112 112  CO2 28 33* 33*  BUN 12 18 22   CREATININE 0.59 0.58 0.64  GLUCOSE 92 164* 145*   Electrolytes  Recent Labs Lab 01/05/13 0435  01/08/13 0535 01/09/13 0510  CALCIUM 9.0 9.1 8.7   Sepsis Markers  Recent Labs Lab 01/19/2013 0406  LATICACIDVEN 2.20   ABG  Recent Labs Lab 01/02/2013 1319  PHART 7.411  PCO2ART 47.8*  PO2ART 145.0*   Liver Enzymes  Recent Labs Lab 01/14/2013 0345 01/04/13 0520  AST 20 15  ALT 8 6  ALKPHOS 83 76  BILITOT 0.3 0.4  ALBUMIN 3.6 3.2*   Cardiac Enzymes  Recent Labs Lab 12/24/2012 0345  PROBNP 5356.0*   Glucose  Recent Labs Lab 01/07/13 2353 01/08/13 0400 01/08/13 0728 01/08/13 1139 01/08/13 2013 01/09/13 0007  GLUCAP 161* 156* 154* 170* 157* 123*    Imaging Mr Brain Wo Contrast  01/08/2013   CLINICAL DATA:  Altered mental status. Status post fall. Facial fractures.  EXAM: MRI HEAD WITHOUT CONTRAST  TECHNIQUE: Multiplanar, multiecho pulse sequences of the brain and surrounding structures were obtained without intravenous contrast.  COMPARISON:  CT head and face 01/13/2013.  FINDINGS: An acute/ subacute hemorrhagic right superior cerebellar infarct is evident. There is mass effect on the 4th ventricle compatible with interval edema. Infarct in hemorrhage extending to the vermis. Although there is partial effacement of the 4th ventricle, there is no hydrocephalus. The ventricles are proportionate to the degree of atrophy without significant change.  Multiple other punctate its foci of susceptibility are evident throughout the supratentorial brain and basal ganglia. The  Atrophy and diffuse white matter disease are evident.  Flow is present in the major intracranial arteries. The patient is status post bilateral lens extractions. Posttraumatic changes of the left maxilla are better described on CT. The intraorbital extra-conal hemorrhage is again seen on the left. No new fractures are evident.  IMPRESSION: 1. Hemorrhagic infarct involving the superior right cerebellum with mass effect on the 4th ventricle but no hydrocephalus. 2. Multiple punctate areas susceptibility  compatible with remote blood products suggest a diffuse vasculitis, specifically amyloid angiopathy. 3. Diffuse atrophy and white matter disease. This likely reflects the sequela of chronic microvascular ischemia. 4. Posttraumatic changes of the left maxilla are again noted.  These results were called by telephone at the time of interpretation on 01/08/2013 at 7:45 PM to [place], RN 6 [place], who verbally acknowledged these results.   Electronically Signed   By: Toribio Harbour.D.  On: 01/08/2013 19:45     CXR: chronic int changes  ASSESSMENT / PLAN:  PULMONARY A: Acute Resp Failure with hypoxia  NOT impressed asp process Chronic int changes  P:   Check cxr in am  Titrate o2 for sat >90%  Will discuss cod status, consider abg  CARDIOVASCULAR A: Atrial Fib with RVR -on amio /dig at home pta  Chronic Systolic and Diastolic CHF EF per echo 2013   P:  Consider starting Cardizem drip  SCD  Not candidate for anticoagulation Avoid MAp less 65 cvp  RENAL A:  Hypernatremia, cerebellar bleed / cva P:   Will discuss need 3% with Neurology Chem in am Avoid any free water  GASTROINTESTINAL A:  Dysphagia , high risk for aspiration  Hx of repaired cleft palate  Malnutrition   P:   Continue on TF Strict asp precautions  PPI   HEMATOLOGIC A:  Anemia   P:  Monitor  Tr hbg , tranx for <7  scd  INFECTIOUS A:  NOT impressed asp P:   Follow fever curve, pcxr in am   ENDOCRINE A:   Mild  Hyperglycemia on TF  Hypothryoid   P:   Add SSI sensitive  Change synthroid to per tube   NEUROLOGIC A:  Right SCA CVA. That CVA, which had presentation was not hemorrhagic, has evolved into a hemorrhagic CVA per MRI  Let orbital Fx prior to admit - 12/21 NS consult - not surgical candidate   P:   Begin 3% NS Infusion per neuro recs Will need family discussion regarding goals of care /code status  Hob elevated Repeat coags, done last 15th  TODAY'S SUMMARY:  79  Yo WF with  multiple medical comorbidities, including hypertension, atrial fibrillation, cardiomyopathy (EF 20-25%), and pulmonary hypertension, admitted 12/15 with altered mental status after fall , worsening mental status , MRI 12/20 with right SCA CVA. That CVA, which had presentation was not hemorrhagic, has evolved into a hemorrhagic CVA. Moved to ICU for PCCM to assume care. Not a surgical candidate per Neurosurgey. For 3% , line, code discussions, coags repeat   I have personally obtained a history, examined the patient, evaluated laboratory and imaging results, formulated the assessment and plan and placed orders. CRITICAL CARE: The patient is critically ill with multiple organ systems failure and requires high complexity decision making for assessment and support, frequent evaluation and titration of therapies, application of advanced monitoring technologies and extensive interpretation of multiple databases. Critical Care Time devoted to patient care services described in this note is 35 minutes.   [facility] NP-C  Pulmonary and Critical Care Medicine [facility] Pager: [phone]  01/09/2013, 12:32 PM   Ccm time 35 min   Mcarthur Rossetti. [name], MD, FACP Pgr: [phone]  Pulmonary & Critical Care

## 2013-01-09 NOTE — Procedures (Signed)
Central Venous Catheter Insertion Procedure Note Anna House [id number] July 31, 1933  Procedure: Insertion of Central Venous Catheter Indications: Assessment of intravascular volume, Drug and/or fluid administration and Frequent blood sampling  Procedure Details Consent: Risks of procedure as well as the alternatives and risks of each were explained to the (patient/caregiver).  Consent for procedure obtained. Time Out: Verified patient identification, verified procedure, site/side was marked, verified correct patient position, special equipment/implants available, medications/allergies/relevent history reviewed, required imaging and test results available.  Performed  Maximum sterile technique was used including antiseptics, cap, gloves, gown, hand hygiene, mask and sheet. Skin prep: Chlorhexidine; local anesthetic administered A antimicrobial bonded/coated triple lumen catheter was placed in the right internal jugular vein using the Seldinger technique.  Evaluation Blood flow good Complications: No apparent complications Patient did tolerate procedure well. Chest X-ray ordered to verify placement.  CXR: pending.  Anna House 01/09/2013, 3:25 PM  [name]. [name], MD, FACP Pgr: [phone] [place] Pulmonary & Critical Care

## 2013-01-09 NOTE — Progress Notes (Signed)
SLP Cancellation Note  Patient Details Name: Anna House MRN: [id number] DOB: 1933-11-19   Cancelled treatment:        Pt currently unavailable due to in-room procedure with MD. Will continue efforts. Karsten Vaughn B. Murvin Natal [facility] CCC-SLP [phone]  Leigh Aurora 01/09/2013, 4:00 PM

## 2013-01-09 NOTE — Progress Notes (Signed)
Following Dr. Gwendolyn Grant discussion with patient's family regarding goals of care. The family had a meeting and decided that patient should be made a DNR. The patient's three sisters and brother are her closest living family and are the ones who reached a consensus regarding code status. She never married or had children and her brother is serving as her POA for healthcare decisions.

## 2013-01-09 NOTE — Progress Notes (Signed)
*  PRELIMINARY RESULTS* Vascular Ultrasound Carotid Duplex (Doppler) has been completed.   Study was technically difficult due to poor patient cooperation, atrial fibrillation, and tachycardia (150bpm). Findings suggest 1-39% internal carotid artery stenosis bilaterally. Vertebral arteries are patent with antegrade flow.  01/09/2013 12:14 PM Gertie Fey, RVT, RDCS, RDMS

## 2013-01-09 NOTE — Consult Note (Signed)
Reason for Consult:  Right superior cerebellar hemorrhagic CVA Referring Physician:  Dr. Ramiro Harvest ([facility])  Anna House is an 79 y.o. female.  HPI: Patient is an elderly female who presented or 6 days ago having for an at home, with decreased level of consciousness and responsiveness. She had apparently fallen at home. Patient was evaluated in the emergency room by Dr. Redgie Grayer (EDP). CT scan of the head was interpreted as "No evidence of acute abnormality, such as acute infarction, hemorrhage, hydrocephalus, or mass lesion/mass effect."  Dr. Redgie Grayer indicates in his note that he spoke with Dr. Amada Jupiter from neurology, who suspected a concussion, and recommended an MRI in the a.m.  Patient was subsequently admitted to the [facility] and has continued under their care since admission. Because of persistent altered mental status Dr. Ramiro Harvest yesterday consulted neurology, and last night an MRI of the brain was performed. This study shows evidence of a right superior cerebellar hemorrhagic CVA. Neurology was notified of the results, and recommended neurosurgical consultation. Elray Mcgregor NP, the overnight mid-level coverage for the floor for the [facility], consulted Dr. Aliene Beams from neurosurgery by phone. He reviewed the case and images with Dr. Coletta Memos. He explained to me that he discussed the case with Anna House and explained that he could come in to do a formal consultation, but that his opinion was that there was no indication for surgical intervention, particularly in consideration of her significant multiple medical comorbidities.   He explained that Anna House indicated to him that he did not need to come in, and that she would document the telephone consultation. I received a request for neurosurgical consultation at about 0845 from Dr. Ramiro Harvest. Discussed the case with Dr. Gerlene Fee as well as again with Dr. Janee Morn. He  indicated that Anna House could document a conversation with Dr. Gerlene Fee in the morning sign out to Dr. Janee Morn, but the [facility] record does not indicate any documentation of her telephone consultation with Dr. Gerlene Fee, and Dr. Janee Morn has indicated that he is uncomfortable documenting Anna House's sign out to him, and therefore has requested neurosurgical consultation. In speaking with Dr. Janee Morn I indicated to him that I do think that if he plans aggressive management of this patient's condition that she would need transfer to the ICU for intense medical management, but that I doubted that there would be a role for neurosurgical intervention.  The patient herself is noncommunicative, and unable to provide any history. No family is present. The patient's history and circumstances are derived from review of the hospital record. It should be noted that initial review of hospital record, the patient was found to have significant hypernatremia (Na 151) and was on D5W for IVF. I explained that although the D5W might be a reasonable consideration for the hypernatremia, it could significantly exacerbate the cytotoxic cerebral edema, and that initially I would recommend changing the IV fluids to normal saline at 50 cc/hr, and subsequently he and/or PCCM will need to carefully manage the patient's volume status and IVF, and try to correct the hypernatremia without exacerbating the cerebral edema.  Patient's CT at admission (on December 15) and MRI last night (on December 20) were reviewed with Dr. Gennette Pac from neuroradiology. The MRI shows a right superior cerebellar artery distribution CVA, that is currently hemorrhagic, but in retrospect an area of decreased density was seen in the right SCA distribution on the admission CT and therefore the patient in  fact presented with a nonhemorrhagic right SCA CVA, which has now become a hemorrhagic right SCA CVA. The admission CT showed no mass effect on the fourth  ventricle, however the MRI scan last night does show some mass effect on the brain stem and fourth ventricle, but no evidence of hydrocephalus, but the CT and the MRI show significant generalized cerebral atrophy.  Past Medical History:  Past Medical History  Diagnosis Date  . Essential hypertension, benign   . PAF (paroxysmal atrial fibrillation)     a. on amiodarone (dose reduced to 100mg  daily 06/2012);  b. Not felt to be anticoagulation candidate.  . Hypothyroidism   . Left bundle branch block   . Cleft palate   . Cardiomyopathy     a. 2012 Cardiolite: small inferoapical ischemic defect-> conservative mgmt;  b. 08/2011 Echo: EF 20-25%, multiple wma's, diastolic dysfxn, nl valves, nl LA size.  . Pulmonary hypertension   . Frequent falls   . Concussion     a. 12/2012  . Left Orbital Fracture     a. 12/2012    Past Surgical History:  Past Surgical History  Procedure Laterality Date  . Colonoscopy  01/09/2011    Procedure: COLONOSCOPY;  Surgeon: Malissa Hippo, MD;  Location: [facility];  Service: Endoscopy;  Laterality: N/A;  2:00 pm    Family History: History reviewed. No pertinent family history.  Social History:  reports that she has never smoked. She has never used smokeless tobacco. She reports that she does not drink alcohol or use illicit drugs.  Allergies:  Allergies  Allergen Reactions  . Penicillins     Medications: I have reviewed the patient's current medications.  ROS:  Unobtainable due to the patient's altered mental status.  Physical Examination: Elderly white female, resting comfortably in bed, with limited responsiveness as described below. Blood pressure 151/89, pulse 99, temperature 98.1 F (36.7 C), temperature source Axillary, resp. rate 20, height 5\' 3"  (1.6 m), weight 55.4 kg (122 lb 2.2 oz), SpO2 99.00%. Lungs:  Clear to auscultation, symmetrical respiratory excursion. Heart:  Irregular irregular tachycardia, no murmur Abdomen:  Soft,  nondistended, bowel sounds present.  Neurological Examination: Exam limited by altered mental status.   Mental Status Examination:  Opens eyes at times to stimulation. No speech, but some moaning. Not answering questions, not following commands. Cranial Nerve Examination:  Pupils 3 mm, bilaterally, round, reactive to light. Corneals present bilaterally.  Face symmetrical.  Gag present. Motor Examination:  Purposeful and localize movement of extremities.  Sensory Examination:  Senses pinprick throughout.  Reflex Examination:   Diminished, symmetrical.  Gait and Stance Examination:  Not testable due to the patient's condition.   Results for orders placed during the hospital encounter of 12/21/2012 (from the past 48 hour(s))  GLUCOSE, CAPILLARY     Status: Abnormal   Collection Time    01/07/13 11:51 AM      Result Value Range   Glucose-Capillary 134 (*) 70 - 99 mg/dL  GLUCOSE, CAPILLARY     Status: Abnormal   Collection Time    01/07/13  5:20 PM      Result Value Range   Glucose-Capillary 150 (*) 70 - 99 mg/dL  GLUCOSE, CAPILLARY     Status: Abnormal   Collection Time    01/07/13  7:59 PM      Result Value Range   Glucose-Capillary 137 (*) 70 - 99 mg/dL   Comment 1 Notify RN    GLUCOSE, CAPILLARY  Status: Abnormal   Collection Time    01/07/13 11:53 PM      Result Value Range   Glucose-Capillary 161 (*) 70 - 99 mg/dL  GLUCOSE, CAPILLARY     Status: Abnormal   Collection Time    01/08/13  4:00 AM      Result Value Range   Glucose-Capillary 156 (*) 70 - 99 mg/dL  BASIC METABOLIC PANEL     Status: Abnormal   Collection Time    01/08/13  5:35 AM      Result Value Range   Sodium 152 (*) 135 - 145 mEq/L   Potassium 3.8  3.5 - 5.1 mEq/L   Chloride 112  96 - 112 mEq/L   CO2 33 (*) 19 - 32 mEq/L   Glucose, Bld 164 (*) 70 - 99 mg/dL   BUN 18  6 - 23 mg/dL   Creatinine, Ser 2.95  0.50 - 1.10 mg/dL   Calcium 9.1  8.4 - 62.1 mg/dL   GFR calc non Af Amer 87 (*) >90 mL/min   GFR  calc Af Amer >90  >90 mL/min   Comment: (NOTE)     The eGFR has been calculated using the CKD EPI equation.     This calculation has not been validated in all clinical situations.     eGFR's persistently <90 mL/min signify possible Chronic Kidney     Disease.  GLUCOSE, CAPILLARY     Status: Abnormal   Collection Time    01/08/13  7:28 AM      Result Value Range   Glucose-Capillary 154 (*) 70 - 99 mg/dL   Comment 1 Notify RN    GLUCOSE, CAPILLARY     Status: Abnormal   Collection Time    01/08/13 11:39 AM      Result Value Range   Glucose-Capillary 170 (*) 70 - 99 mg/dL   Comment 1 Notify RN    GLUCOSE, CAPILLARY     Status: Abnormal   Collection Time    01/08/13  8:13 PM      Result Value Range   Glucose-Capillary 157 (*) 70 - 99 mg/dL  GLUCOSE, CAPILLARY     Status: Abnormal   Collection Time    01/09/13 12:07 AM      Result Value Range   Glucose-Capillary 123 (*) 70 - 99 mg/dL   Comment 1 Documented in Chart     Comment 2 Notify RN    BASIC METABOLIC PANEL     Status: Abnormal   Collection Time    01/09/13  5:10 AM      Result Value Range   Sodium 151 (*) 135 - 145 mEq/L   Potassium 3.7  3.5 - 5.1 mEq/L   Chloride 112  96 - 112 mEq/L   CO2 33 (*) 19 - 32 mEq/L   Glucose, Bld 145 (*) 70 - 99 mg/dL   BUN 22  6 - 23 mg/dL   Creatinine, Ser 3.08  0.50 - 1.10 mg/dL   Calcium 8.7  8.4 - 65.7 mg/dL   GFR calc non Af Amer 84 (*) >90 mL/min   GFR calc Af Amer >90  >90 mL/min   Comment: (NOTE)     The eGFR has been calculated using the CKD EPI equation.     This calculation has not been validated in all clinical situations.     eGFR's persistently <90 mL/min signify possible Chronic Kidney     Disease.  CBC     Status: Abnormal  Collection Time    01/09/13  5:10 AM      Result Value Range   WBC 8.3  4.0 - 10.5 K/uL   RBC 3.67 (*) 3.87 - 5.11 MIL/uL   Hemoglobin 10.5 (*) 12.0 - 15.0 g/dL   HCT 45.4 (*) 09.8 - 11.9 %   MCV 96.2  78.0 - 100.0 fL   MCH 28.6  26.0 - 34.0  pg   MCHC 29.7 (*) 30.0 - 36.0 g/dL   RDW 14.7  82.9 - 56.2 %   Platelets 244  150 - 400 K/uL    Mr Brain Wo Contrast  01/08/2013   CLINICAL DATA:  Altered mental status. Status post fall. Facial fractures.  EXAM: MRI HEAD WITHOUT CONTRAST  TECHNIQUE: Multiplanar, multiecho pulse sequences of the brain and surrounding structures were obtained without intravenous contrast.  COMPARISON:  CT head and face 01/19/2013.  FINDINGS: An acute/ subacute hemorrhagic right superior cerebellar infarct is evident. There is mass effect on the 4th ventricle compatible with interval edema. Infarct in hemorrhage extending to the vermis. Although there is partial effacement of the 4th ventricle, there is no hydrocephalus. The ventricles are proportionate to the degree of atrophy without significant change.  Multiple other punctate its foci of susceptibility are evident throughout the supratentorial brain and basal ganglia. The  Atrophy and diffuse white matter disease are evident.  Flow is present in the major intracranial arteries. The patient is status post bilateral lens extractions. Posttraumatic changes of the left maxilla are better described on CT. The intraorbital extra-conal hemorrhage is again seen on the left. No new fractures are evident.  IMPRESSION: 1. Hemorrhagic infarct involving the superior right cerebellum with mass effect on the 4th ventricle but no hydrocephalus. 2. Multiple punctate areas susceptibility compatible with remote blood products suggest a diffuse vasculitis, specifically amyloid angiopathy. 3. Diffuse atrophy and white matter disease. This likely reflects the sequela of chronic microvascular ischemia. 4. Posttraumatic changes of the left maxilla are again noted.  These results were called by telephone at the time of interpretation on 01/08/2013 at 7:45 PM to [place], RN 6 [place], who verbally acknowledged these results.   Electronically Signed   By: Gennette Pac M.D.   On: 01/08/2013 19:45      Assessment/Plan: Elderly female with multiple medical comorbidities, including hypertension, atrial fibrillation, cardiomyopathy (EF 20-25%), and pulmonary hypertension, who presented after having fallen at home over 6 days ago, and who in retrospect had suffered a right SCA CVA. That CVA, which had presentation was not hemorrhagic, has evolved into a hemorrhagic CVA.patient has had altered mental status with decreased level of consciousness and responsiveness since presentation 6 days ago. Additional immediate problems include significant hypernatremia with a serum sodium of 151.  Neurosurgical consultation was requested by Dr. Ramiro Harvest, after his mid-level provider already discussed the case extensively last night with Dr. Gerlene Fee, and agreed to document their discussion. I agree with Dr. Gerlene Fee that there is no role for neurosurgical intervention at this time. There is no hydrocephalus, and therefore no indication for placement of the IVC. Further I do not feel that a suboccipital craniectomy and right cerebellar hemispherectomy will improve the patient's prognosis for neurologic recovery. Such a heroic intervention would be fraught with significant risk of complications, and the overall prognosis for survival and recovery would not be enhanced. I feel that intensive medical care offers the best chance for the patient's survival in recovery. This intensive medical care of this patient's CVA will be managed  by the PCCM service, the [facility] hospitalist service, and the neurology/stroke neurology service.   Hewitt Shorts, MD 01/09/2013, 10:13 AM

## 2013-01-09 NOTE — Progress Notes (Signed)
eLink Physician-Brief Progress Note Patient Name: Anna House DOB: [birth year] MRN: [id number]  Date of Service  01/09/2013   HPI/Events of Note   RN reports family requests DNR status per earlier conversation with Dr. Rea College. Family is not available to discuss.   eICU Interventions   Asked the RN to document family wishes DNR order placed    Intervention Category Intermediate Interventions: Communication with other healthcare providers and/or family  Lonia Farber 01/09/2013, 6:39 PM

## 2013-01-09 NOTE — Progress Notes (Addendum)
Stroke Team Progress Note  HISTORY Anna House is a 79 y.o. female who experienced a witnessed fall while ambulating with her walker at home. The patient struck the front of her head with loss of consciousness. EMS was summoned and the patient was brought to the emergency department. Apparently she had an episode of vomiting while semi conscious. She woke up briefly while in the emergency room and was noted to be partially oriented with the ability to follow some commands. A CT scan showed no acute intracranial injury. She was noted to have a left ORBIT fracture as a result of the fall. Over the next several days she remained lethargic although she was oriented and was able to move all of her extremities. According to his chart there were no obvious neurologic deficits. She developed respiratory distress and was felt to have aspiration pneumonia. A feeding tube was placed. On 01/07/2013 she was noted to be more alert but still lethargic. On 01/08/2013 a decision was made to obtain a neurology consult and an MRI. The MRI revealed a hemorrhagic infarct involving the superior right cerebellum with a mass effect on the fourth ventricle but no hydrocephalus. Dr. Cyril Mourning was contacted as well as Dr. Newell Coral. Surgical intervention was not felt to be indicated. The patient will be moved to the neuro intensive care unit.   SUBJECTIVE No family members present. The patient calls out occasionally: However there is no meaningful conversation.  OBJECTIVE Most recent Vital Signs: Filed Vitals:   01/08/13 0639 01/08/13 1430 01/08/13 2104 01/09/13 0500  BP:  152/92 145/90 151/89  Pulse:  87 90 99  Temp:  98.1 F (36.7 C) 98.2 F (36.8 C) 98.1 F (36.7 C)  TempSrc:  Axillary Axillary Axillary  Resp:  20 20 20   Height:      Weight: 54.3 kg (119 lb 11.4 oz)   55.4 kg (122 lb 2.2 oz)  SpO2:  99% 99% 99%   CBG (last 3)   Recent Labs  01/08/13 1139 01/08/13 2013 01/09/13 0007  GLUCAP 170* 157* 123*     IV Fluid Intake:   . feeding supplement (JEVITY 1.2 CAL) 1,000 mL (01/07/13 1728)  . sodium chloride 0.9 % 1,000 mL infusion 50 mL/hr at 01/09/13 4098    MEDICATIONS  . levothyroxine  50 mcg Intravenous QAC breakfast  . metoprolol  5 mg Intravenous Q6H  . sodium chloride  3 mL Intravenous Q12H  . white petrolatum       PRN:  acetaminophen, labetalol, ondansetron (ZOFRAN) IV  Diet:  NPO no liquids Activity:  Up with assistance DVT Prophylaxis:  SCDs  CLINICALLY SIGNIFICANT STUDIES Basic Metabolic Panel:   Recent Labs Lab 01/08/13 0535 01/09/13 0510  NA 152* 151*  K 3.8 3.7  CL 112 112  CO2 33* 33*  GLUCOSE 164* 145*  BUN 18 22  CREATININE 0.58 0.64  CALCIUM 9.1 8.7   Liver Function Tests:   Recent Labs Lab 01/13/2013 0345 01/04/13 0520  AST 20 15  ALT 8 6  ALKPHOS 83 76  BILITOT 0.3 0.4  PROT 7.5 6.7  ALBUMIN 3.6 3.2*   CBC:  Recent Labs Lab 01/07/2013 0345  01/05/13 0435 01/09/13 0510  WBC 17.4*  < > 12.4* 8.3  NEUTROABS 12.7*  --   --   --   HGB 12.5  < > 12.1 10.5*  HCT 39.6  < > 39.4 35.3*  MCV 92.5  < > 95.2 96.2  PLT 456*  < > 279 244  < > =  values in this interval not displayed. Coagulation:   Recent Labs Lab 2013-01-22 0345  LABPROT 12.9  INR 0.99   Cardiac Enzymes:   Recent Labs Lab 22-Jan-2013 1447  CKTOTAL 50   Urinalysis:   Recent Labs Lab 01-22-13 1420  COLORURINE YELLOW  LABSPEC 1.015  PHURINE 7.0  GLUCOSEU NEGATIVE  HGBUR NEGATIVE  BILIRUBINUR NEGATIVE  KETONESUR NEGATIVE  PROTEINUR 100*  UROBILINOGEN 0.2  NITRITE NEGATIVE  LEUKOCYTESUR NEGATIVE   Lipid Panel No results found for this basename: chol,  trig,  hdl,  cholhdl,  vldl,  ldlcalc   HgbA1C  No results found for this basename: HGBA1C    Urine Drug Screen:   No results found for this basename: labopia,  cocainscrnur,  labbenz,  amphetmu,  thcu,  labbarb    Alcohol Level: No results found for this basename: ETH,  in the last 168 hours  Mr Brain Wo  Contrast  01/08/2013    1. Hemorrhagic infarct involving the superior right cerebellum with mass effect on the 4th ventricle but no hydrocephalus. 2. Multiple punctate areas susceptibility compatible with remote blood products suggest a diffuse vasculitis, specifically amyloid angiopathy. 3. Diffuse atrophy and white matter disease. This likely reflects the sequela of chronic microvascular ischemia. 4. Posttraumatic changes of the left maxilla are again noted.    CT of the brain   01-22-2013   1. No evidence of acute intracranial injury.  2. Negative for acute cervical spine fracture.  3. Left orbital floor fracture with thin extraconal hemorrhage that  does not cause proptosis.  4. Nondisplaced inferior left maxillary sinus fractures.  2D Echocardiogram  pending  Carotid Doppler  pending  CXR   01/06/2013 1. Persistent findings of chronic pulmonary venous congestion  without definite acute cardiopulmonary disease on this [facility] portable  examination.  2. Grossly unchanged findings of a cardiomegaly, hypoventilation and  left basilar/ retrocardiac opacities, stable since the 02/2010  examination and thus favored to represent atelectasis or scar.   EKG Afib VR 82 BPM  Therapy Recommendations skilled nursing facility recommended  Physical Exam   Neurologic Examination  Mental Status:  The patient is lying in bed with eyes closed. She does not respond to voice. With deep sternal rub the patient groans and localizes to pain with both upper extremities. She follows no commands.  Cranial Nerves:  II: Pupils 3mm, round and briskly reactive bilaterally. There is no blink to threat bilaterally. The left orbit is ecchymotic.  III,IV, VI: No response to oculocephalic maneuvers  V,VII: Corneals intact bilaterally  VIII: Could not be tested  IX,X: gag reflex is present XI: Shoulder shrug could not be tested  XII: The patient does not protrude her tongue  Motor:  Patient lifts both upper  extremities off bed in response to pain. Movement noted in the right lower extremity as well. Only minimal LLE movement noted.  Sensory: Patient responds to noxious stimuli in all extremities.  Deep Tendon Reflexes: 2+ in the upper extremities, 1+ at the knees and absent at ankles  Plantars:  Right: Upgoing Left: Upgoing  Cerebellar:  Could not be tested  Gait: Could not be tested   ASSESSMENT Ms. Anna House is a 79 y.o. female presenting with LOC after falling while ambulating with her walker. TPA was not indicated secondary to hemorrhage. An MRI revealed a hemorrhagic infarct involving the superior right cerebellum with mass effect on the 4th ventricle but no hydrocephalus. Hemorrhage felt to be secondary to trauma after the patient fell  at home while ambulating with her walker. On no antithrombotics prior to admission. Now on no antithrombotics for secondary stroke prevention. Patient with resultant decreased level of responsiveness.. Work up underway.   Fractured left orbit secondary to falling prior to admission  Atrial fibrillation with rapid ventricular response  Acute encephalopathy  NPO  Hospital day # 6  TREATMENT/PLAN  Continue no antithrombotics for secondary stroke prevention secondary to hemorrhage.  Goal SBP <160  Repeat Head CT AM 12/22  Start patient on hypertonic saline, goal sodium level 150-155 (current level 151). Discussed with CCM, they will place central line  Therapists recommend skilled nursing facility placement  Carotid Dopplers and 2-D echo pending  Consider tube feedings  Discussed with with Dr. Newell Coral - no surgery planned.  Delton See PA-C [facility] Pager [phone] 01/09/2013, 10:01 AM  This patient is critically ill and at significant risk of neurological worsening, death and care requires constant monitoring of vital signs, hemodynamics,respiratory and cardiac monitoring,review of multiple databases,  neurological assessment, discussion with family, other specialists and medical decision making of high complexity. I spent 35 minutes of neurocritical care time in the care of this patient.  I have personally obtained a history, examined the patient, evaluated imaging results, and formulated the assessment and plan of care. I agree with the above.  Elspeth Cho, DO Neurology-Stroke

## 2013-01-09 NOTE — Progress Notes (Signed)
[facility] PROGRESS NOTE  Anna House UVO:[id number] DOB: 02-19-1933 DOA: 01/02/2013 PCP: Colon Branch, MD  Assessment/Plan: #1 large hemorrhagic cerebellar infarction Likely etiology of patient's fall prior to admission. MRI of the head consistent with large hemorrhagic cerebellar infarct with mass effect on the fourth ventricle but no hydro-saphenous. Multiple punctured a reassess the stability compatible with remote blood products suggested diffuse vasculitis specifically amyloid angiopathy. Posttraumatic changes of the left maxillary again noted. Diffuse atrophic and white matter disease. CT of the head on admission was negative. Patient is still lethargic with minimal response to noxious stimuli. Patient is also hypernatremic. Will transfer patient to the neurosurgical ICU. Neuro checks every 2 hours. Will undergo the stroke workup including carotid Dopplers, 2-D echo, fasting lipid panel. Patient currently not on any anticoagulation. We'll consult with neurosurgery. Spoke with Dr. Newell Coral will evaluate the patient. Also spoke with Dr. [name] of pulmonary and critical care will also assess the patient in the ICU setting. Change D5W to normal saline at 50 cc an hour to help prevent worsening cerebral edema. Follow.  #2 acute encephalopathy Likely secondary to problem #1. Patient is still lethargic and minimally responsive to noxious stimuli. Initially felt to be likely secondary to a concussion. However patient has been lethargic and minimally responsive since admission. CT of the head which was done was negative. Chest x-ray was negative for any acute infiltrate. MRI of the head consistent with large hemorrhagic cerebellar infarction. See problem #1.  #3 acute respiratory failure with hypoxia/aspiration pneumonitis Stable. Chest x-ray was a retrocardiac opacities have a stable and likely scar tissue. Patient currently 99% on 2 L. Titrate O2. Follow.  #4 fall/concussion/left  orbital fracture Likely secondary to problem #1. Patient is still lethargic. CT of the head negative for any acute abnormalities. Patient has been seen by physical therapy recommending skilled nursing facility on discharge. NG tube has been placed and patient has been started on tube feeds. Speech therapy to reassess as patient improves in terms of her alertness. MRI of the head consistent with hemorrhagic infarction. Neurology following.  #5 atrial fibrillation Continue IV Lopressor for rate control. Patient not on anticoagulation candidate secondary to history of multiple falls and now problem #1. May consider resuming home regimen through panda tube in the next day.  #6 hypokalemia/hypernatremia Repleted. For patient's hyponatremia will change D5W to normal saline at 50 cc an hour to decrease likelihood of worsening cerebral edema. Monitor closely.  #7 left bundle branch block Chronic. No further workup needed at this time.  #8 chronic combined systolic and diastolic CHF EF 74% per echo 2013 Continue home regimen. No further workup needed at this time. Monitor volume.  #9 avascular necrosis of the hip Patient with chronic hip pain. Dr. Butler Denmark discussed with family and patient is not a candidate for hip replacement. Outpatient followup. Pain management.  #10 history of repaired cleft palate NG tube has been placed for feeding as patient is too lethargic to oral intake. Speech therapy to reassess when patient alertness improves.  #11 nuitrition Patient on Jevity tube feeds.  #12 prophylaxis PPI for GI prophylaxis. SCDs for DVT prophylaxis.  Code Status: Full Family Communication: No family at bedside. Disposition Plan: Transfer to neurosurgical ICU 3100   Consultants:  Cardiology: Dr. Dr. Shirlee Latch 01/04/2013  Neurology: Dr. Thad Ranger 01/08/2013  Neurosurgery pending  Critical care medicine pending  Procedures:  CT head/ CT C-spine /CT maxillofacial 01/04/2013  Abdominal  x-ray 01/06/2013  Chest x-ray 01/06/2013, 12/29/2012  X-rays of the  pelvis 12/20/2012  NG tube placed 01/06/2013  MRI head 01/08/2013  Antibiotics:  None  HPI/Subjective: Patient lethargic. Minimally responsive to noxious stimuli.  Objective: Filed Vitals:   01/09/13 0500  BP: 151/89  Pulse: 99  Temp: 98.1 F (36.7 C)  Resp: 20    Intake/Output Summary (Last 24 hours) at 01/09/13 0844 Last data filed at 01/09/13 0600  Gross per 24 hour  Intake   2279 ml  Output      0 ml  Net   2279 ml   Filed Weights   01/07/13 0503 01/08/13 0639 01/09/13 0500  Weight: 55 kg (121 lb 4.1 oz) 54.3 kg (119 lb 11.4 oz) 55.4 kg (122 lb 2.2 oz)    Exam:  General:  Patient is lethargic with minimal response to noxious stimuli. L orbital edema has decreased, ecchymosis.  Cardiovascular: Irregularly irregular. No JVD. No edema.  Respiratory: Clear to auscultation bilaterally anterior lung fields.  Abdomen: Soft, nontender, nondistended, positive bowel sounds.  Musculoskeletal: No clubbing cyanosis or edema.  Neurological: Patient is minimally responsive moves upper extremities to noxious stimuli with moaning. Lethargic.  Data Reviewed: Basic Metabolic Panel:  Recent Labs Lab 01/01/2013 1800 01/04/13 0520 01/05/13 0435 01/08/13 0535 01/09/13 0510  NA 142 142 142 152* 151*  K 4.4 4.1 3.7 3.8 3.7  CL 102 104 103 112 112  CO2 28 29 28  33* 33*  GLUCOSE 96 84 92 164* 145*  BUN 8 8 12 18 22   CREATININE 0.62 0.66 0.59 0.58 0.64  CALCIUM 8.9 9.0 9.0 9.1 8.7   Liver Function Tests:  Recent Labs Lab 01/09/2013 0345 01/04/13 0520  AST 20 15  ALT 8 6  ALKPHOS 83 76  BILITOT 0.3 0.4  PROT 7.5 6.7  ALBUMIN 3.6 3.2*   No results found for this basename: LIPASE, AMYLASE,  in the last 168 hours No results found for this basename: AMMONIA,  in the last 168 hours CBC:  Recent Labs Lab 01/18/2013 0345 01/04/13 0520 01/05/13 0435 01/09/13 0510  WBC 17.4* 9.7 12.4* 8.3   NEUTROABS 12.7*  --   --   --   HGB 12.5 11.3* 12.1 10.5*  HCT 39.6 37.0 39.4 35.3*  MCV 92.5 94.1 95.2 96.2  PLT 456* 296 279 244   Cardiac Enzymes:  Recent Labs Lab 12/24/2012 1447  CKTOTAL 50   BNP (last 3 results)  Recent Labs  01/11/2013 0345  PROBNP 5356.0*   CBG:  Recent Labs Lab 01/08/13 0400 01/08/13 0728 01/08/13 1139 01/08/13 2013 01/09/13 0007  GLUCAP 156* 154* 170* 157* 123*    Recent Results (from the past 240 hour(s))  MRSA PCR SCREENING     Status: None   Collection Time    01/16/2013  1:56 PM      Result Value Range Status   MRSA by PCR NEGATIVE  NEGATIVE Final   Comment:            The GeneXpert MRSA Assay (FDA     approved for NASAL specimens     only), is one component of a     comprehensive MRSA colonization     surveillance program. It is not     intended to diagnose MRSA     infection nor to guide or     monitor treatment for     MRSA infections.  URINE CULTURE     Status: None   Collection Time    01/10/2013  2:20 PM      Result Value  Range Status   Specimen Description URINE, CATHETERIZED   Final   Special Requests NONE   Final   Culture  Setup Time     Final   Value: 2013-01-16 14:39     Performed at [facility]   Colony Count     Final   Value: NO GROWTH     Performed at [facility]   Culture     Final   Value: NO GROWTH     Performed at [facility]   Report Status 01/04/2013 FINAL   Final     Studies: Mr Brain Wo Contrast  01/08/2013   CLINICAL DATA:  Altered mental status. Status post fall. Facial fractures.  EXAM: MRI HEAD WITHOUT CONTRAST  TECHNIQUE: Multiplanar, multiecho pulse sequences of the brain and surrounding structures were obtained without intravenous contrast.  COMPARISON:  CT head and face 01/16/13.  FINDINGS: An acute/ subacute hemorrhagic right superior cerebellar infarct is evident. There is mass effect on the 4th ventricle compatible with interval edema. Infarct in hemorrhage  extending to the vermis. Although there is partial effacement of the 4th ventricle, there is no hydrocephalus. The ventricles are proportionate to the degree of atrophy without significant change.  Multiple other punctate its foci of susceptibility are evident throughout the supratentorial brain and basal ganglia. The  Atrophy and diffuse white matter disease are evident.  Flow is present in the major intracranial arteries. The patient is status post bilateral lens extractions. Posttraumatic changes of the left maxilla are better described on CT. The intraorbital extra-conal hemorrhage is again seen on the left. No new fractures are evident.  IMPRESSION: 1. Hemorrhagic infarct involving the superior right cerebellum with mass effect on the 4th ventricle but no hydrocephalus. 2. Multiple punctate areas susceptibility compatible with remote blood products suggest a diffuse vasculitis, specifically amyloid angiopathy. 3. Diffuse atrophy and white matter disease. This likely reflects the sequela of chronic microvascular ischemia. 4. Posttraumatic changes of the left maxilla are again noted.  These results were called by telephone at the time of interpretation on 01/08/2013 at 7:45 PM to [place], RN 6 [place], who verbally acknowledged these results.   Electronically Signed   By: Gennette Pac M.D.   On: 01/08/2013 19:45    Scheduled Meds: . levothyroxine  50 mcg Intravenous QAC breakfast  . metoprolol  5 mg Intravenous Q6H  . sodium chloride  3 mL Intravenous Q12H  . white petrolatum       Continuous Infusions: . feeding supplement (JEVITY 1.2 CAL) 1,000 mL (01/07/13 1728)  . sodium chloride 0.9 % 1,000 mL infusion 50 mL/hr at 01/09/13 1610    Principal Problem:   Acute CVA (cerebrovascular accident) Active Problems:   Atrial fibrillation   Left bundle branch block   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 25% per ECHO 2013   Left orbit fracture   Fall at home   Aspiration  pneumonitis   Concussion with 1-24 hours loss of consciousness   Acute respiratory failure with hypoxia   Hypokalemia   Avascular necrosis of bone of hip   Hypernatremia   Acute encephalopathy   Intracerebral hemorrhage    Time spent: 30 minutes    Axzel Rockhill M.D. [facility] Pager [phone]. If 7PM-7AM, please contact night-coverage at [email or web address], password [facility] 01/09/2013, 8:44 AM  LOS: 6 days

## 2013-01-10 ENCOUNTER — Inpatient Hospital Stay (HOSPITAL_COMMUNITY): Payer: Medicare Other

## 2013-01-10 ENCOUNTER — Encounter (HOSPITAL_COMMUNITY): Payer: Self-pay | Admitting: Radiology

## 2013-01-10 DIAGNOSIS — I319 Disease of pericardium, unspecified: Secondary | ICD-10-CM

## 2013-01-10 LAB — COMPREHENSIVE METABOLIC PANEL
ALT: 21 U/L (ref 0–35)
AST: 35 U/L (ref 0–37)
Alkaline Phosphatase: 72 U/L (ref 39–117)
BUN: 22 mg/dL (ref 6–23)
CO2: 34 mEq/L — ABNORMAL HIGH (ref 19–32)
Calcium: 8.6 mg/dL (ref 8.4–10.5)
Creatinine, Ser: 0.56 mg/dL (ref 0.50–1.10)
GFR calc non Af Amer: 88 mL/min — ABNORMAL LOW (ref >90)
Total Protein: 5.8 g/dL — ABNORMAL LOW (ref 6.0–8.3)

## 2013-01-10 LAB — CBC
HCT: 33.8 % — ABNORMAL LOW (ref 36.0–46.0)
Hemoglobin: 9.9 g/dL — ABNORMAL LOW (ref 12.0–15.0)
MCH: 28.4 pg (ref 26.0–34.0)
MCHC: 29.3 g/dL — ABNORMAL LOW (ref 30.0–36.0)
RBC: 3.49 MIL/uL — ABNORMAL LOW (ref 3.87–5.11)
WBC: 7.2 10*3/uL (ref 4.0–10.5)

## 2013-01-10 LAB — GLUCOSE, CAPILLARY
Glucose-Capillary: 123 mg/dL — ABNORMAL HIGH (ref 70–99)
Glucose-Capillary: 126 mg/dL — ABNORMAL HIGH (ref 70–99)
Glucose-Capillary: 136 mg/dL — ABNORMAL HIGH (ref 70–99)
Glucose-Capillary: 138 mg/dL — ABNORMAL HIGH (ref 70–99)
Glucose-Capillary: 122 mg/dL — ABNORMAL HIGH (ref 70–99)
Glucose-Capillary: 115 mg/dL — ABNORMAL HIGH (ref 70–99)

## 2013-01-10 LAB — SODIUM
Sodium: 155 mEq/L — ABNORMAL HIGH (ref 135–145)
Sodium: 156 mEq/L — ABNORMAL HIGH (ref 135–145)

## 2013-01-10 MED ORDER — DILTIAZEM 12 MG/ML ORAL SUSPENSION
60.0000 mg | Freq: Four times a day (QID) | ORAL | Status: DC
  Administered 2013-01-10 – 2013-01-12 (×8): 60 mg via ORAL
  Filled 2013-01-10 (×12): qty 6

## 2013-01-10 NOTE — Procedures (Signed)
ELECTROENCEPHALOGRAM REPORT   Patient: Anna House       Room #: [id number] EEG No. ID: [id number] Age: 79 y.o.        Sex: female Referring Physician: [name] Report Date:  01/10/2013        Interpreting Physician: Thana Farr D  History: [name] is an 79 y.o. female with altered mental status and acute infarct evaluated to rule out nonconvulsive seizures.    Medications:  Scheduled: . antiseptic oral rinse  15 mL Mouth Rinse q12n4p  . chlorhexidine  15 mL Mouth Rinse BID  . diltiazem  60 mg Oral Q6H  . insulin aspart  1-3 Units Subcutaneous Q4H  . levothyroxine  100 mcg Per Tube QAC breakfast  . metoprolol  5 mg Intravenous Q6H  . pantoprazole sodium  40 mg Per Tube Daily  . sodium chloride  3 mL Intravenous Q12H    Conditions of Recording:  This is a 16 channel EEG carried out with the patient in the poorly responsive state.  Description:  The patient must be stimulated to evaluate any waking background activity.  During this time the waking background activity consists of a low voltage, symmetrical, fairly well organized, 7 Hz theta activity, seen from the parieto-occipital and posterior temporal regions.  Low voltage fast activity, poorly organized, is seen anteriorly and is at times superimposed on more posterior regions.  A mixture of theta and alpha rhythms are seen from the central and temporal regions. The patient drowses for the majority of the tracing with a slow diffusely distributed background that consists mostly of delta activity.  Some occasional theta activity is noted as well.  This activity is not continuous and there are periods of attenuation noted as well.  These periods are not always synchronous.  They are short-lived and last up to 5 seconds.  There is no epileptiform activity.  .   Hyperventilation and intermittent photic stimulation were not performed.  IMPRESSION: This is an abnormal EEG.  The posterior background slowing noted during wakefulness may  be an indicator of how difficult it is to arouse the patient fully or may represent a diffuse gray matter disturbance that is etiologically nonspecific but may include a metabolic encephalopathy among other possibilities.  Although drowsiness exhibits abnormal characteristics no epileptiform activity is noted.     Thana Farr, MD [facility] [phone] 01/10/2013, 7:55 PM

## 2013-01-10 NOTE — Progress Notes (Signed)
Speech Language Pathology Treatment: Dysphagia  Patient Details Name: Anna House MRN: [id number] DOB: 27-May-1933 Today's Date: 01/10/2013 Time: 9811-9147 SLP Time Calculation (min): 14 min  Assessment / Plan / Recommendation Clinical Impression  Diagnostic treatment complete with focus on readiness for pos. Patient alert, occassional mumbling noted, unable to follow commands despite max cueing. Oral care complete with limited oral response, patient clenching jaw, little lingual movement, no initiation of swallow with saliva despite max verbal and tactile cues. Patient not ready for further po trials. Will continue to f/u.    HPI HPI: Anna House is a 79 y.o. female who suffered a witnessed fall while using her walker at home admitted on 01/10/2013.  She struck the front of her head and subsequently had LOC.  After EMS arrived she had 1 episode of vomiting while unconscious but seemed to guard her airway per MD note.  After arrival to the ED she remained unconscious though with purposeful movements, neurology was curb sided and felt this was likely a complicated concussion.  Pt isn't able to speak very well at baseline due to a longstanding cleft palate.  Swallow evaluation has been ordered but hindered by pt's lethargy.        SLP Plan  Continue with current plan of care    Recommendations Diet recommendations: NPO Medication Administration: Via alternative means              Oral Care Recommendations: Oral care Q4 per protocol Plan: Continue with current plan of care    GO   [facility] MA, CCC-SLP [phone]   [name] 01/10/2013, 1:26 PM

## 2013-01-10 NOTE — Progress Notes (Signed)
Stroke Team Progress Note  HISTORY Anna House is a 79 y.o. female who experienced a witnessed fall while ambulating with her walker at home. The patient struck the front of her head with loss of consciousness. EMS was summoned and the patient was brought to the emergency department. Apparently she had an episode of vomiting while semi conscious. She woke up briefly while in the emergency room and was noted to be partially oriented with the ability to follow some commands. A CT scan showed no acute intracranial injury. She was noted to have a left ORBIT fracture as a result of the fall. Over the next several days she remained lethargic although she was oriented and was able to move all of her extremities. According to his chart there were no obvious neurologic deficits. She developed respiratory distress and was felt to have aspiration pneumonia. A feeding tube was placed. On 01/07/2013 she was noted to be more alert but still lethargic. On 01/08/2013 a decision was made to obtain a neurology consult and an MRI. The MRI revealed a hemorrhagic infarct involving the superior right cerebellum with a mass effect on the fourth ventricle but no hydrocephalus. Dr. Cyril Mourning was contacted as well as Dr. Newell Coral. Surgical intervention was not felt to be indicated. The patient will be moved to the neuro intensive care unit.   SUBJECTIVE No family members present. The patient appears more alert   OBJECTIVE Most recent Vital Signs: Filed Vitals:   01/10/13 0600 01/10/13 0700 01/10/13 0734 01/10/13 0800  BP: 145/65 152/95  136/80  Pulse: 88 48  53  Temp:   98.5 F (36.9 C)   TempSrc:   Oral   Resp: 26 30  28   Height:      Weight:      SpO2: 98% 96%  96%   CBG (last 3)   Recent Labs  01/10/13 0041 01/10/13 0427 01/10/13 0732  GLUCAP 123* 126* 136*    IV Fluid Intake:   . diltiazem (CARDIZEM) infusion 10 mg/hr (01/10/13 0800)  . feeding supplement (JEVITY 1.2 CAL) 1,000 mL (01/09/13 1629)  .  sodium chloride (hypertonic) Stopped (01/10/13 0600)  . sodium chloride 0.9 % 1,000 mL infusion Stopped (01/09/13 1900)    MEDICATIONS  . antiseptic oral rinse  15 mL Mouth Rinse q12n4p  . chlorhexidine  15 mL Mouth Rinse BID  . insulin aspart  1-3 Units Subcutaneous Q4H  . levothyroxine  100 mcg Per Tube QAC breakfast  . metoprolol  5 mg Intravenous Q6H  . pantoprazole sodium  40 mg Per Tube Daily  . sodium chloride  3 mL Intravenous Q12H   PRN:  acetaminophen, labetalol, ondansetron (ZOFRAN) IV  Diet:  NPO no liquids. Jevity tube feedings. Activity:  Up with assistance DVT Prophylaxis:  SCDs  CLINICALLY SIGNIFICANT STUDIES Basic Metabolic Panel:   Recent Labs Lab 01/09/13 0510  01/10/13 0105 01/10/13 0450  NA 151*  < > 155* 158*  K 3.7  --   --  3.7  CL 112  --   --  121*  CO2 33*  --   --  34*  GLUCOSE 145*  --   --  134*  BUN 22  --   --  22  CREATININE 0.64  --   --  0.56  CALCIUM 8.7  --   --  8.6  < > = values in this interval not displayed. Liver Function Tests:   Recent Labs Lab 01/04/13 0520 01/10/13 0450  AST 15 35  ALT 6 21  ALKPHOS 76 72  BILITOT 0.4 0.7  PROT 6.7 5.8*  ALBUMIN 3.2* 2.4*   CBC:   Recent Labs Lab 01/09/13 0510 01/10/13 0450  WBC 8.3 7.2  HGB 10.5* 9.9*  HCT 35.3* 33.8*  MCV 96.2 96.8  PLT 244 211   Coagulation:   Recent Labs Lab 01/09/13 2218  LABPROT 15.2  INR 1.23   Cardiac Enzymes:   Recent Labs Lab 2013/01/24 1447  CKTOTAL 50   Urinalysis:   Recent Labs Lab 01/24/2013 1420  COLORURINE YELLOW  LABSPEC 1.015  PHURINE 7.0  GLUCOSEU NEGATIVE  HGBUR NEGATIVE  BILIRUBINUR NEGATIVE  KETONESUR NEGATIVE  PROTEINUR 100*  UROBILINOGEN 0.2  NITRITE NEGATIVE  LEUKOCYTESUR NEGATIVE   Lipid Panel    Component Value Date/Time   CHOL 170 01/09/2013 0510   HgbA1C  No results found for this basename: HGBA1C    Urine Drug Screen:   No results found for this basename: labopia,  cocainscrnur,  labbenz,   amphetmu,  thcu,  labbarb    Alcohol Level: No results found for this basename: ETH,  in the last 168 hours  Mr Brain Wo Contrast  01/08/2013    1. Hemorrhagic infarct involving the superior right cerebellum with mass effect on the 4th ventricle but no hydrocephalus. 2. Multiple punctate areas susceptibility compatible with remote blood products suggest a diffuse vasculitis, specifically amyloid angiopathy. 3. Diffuse atrophy and white matter disease. This likely reflects the sequela of chronic microvascular ischemia. 4. Posttraumatic changes of the left maxilla are again noted.    CT of the brain   January 24, 2013   1. No evidence of acute intracranial injury.  2. Negative for acute cervical spine fracture.  3. Left orbital floor fracture with thin extraconal hemorrhage that  does not cause proptosis.  4. Nondisplaced inferior left maxillary sinus fractures.  CT of brain 01/10/2013 1. Similar size and distribution of right cerebellar infarct with  similar mass effect on the adjacent 4th ventricle. No evidence of  hydrocephalus or new intracranial hemorrhage identified.  2. Grossly stable left orbital floor fracture.  3. Atrophy with chronic microvascular ischemic disease.  EEG - pending  2D Echocardiogram  pending  Carotid Doppler  Findings suggest 1-39% internal carotid artery stenosis bilaterally. Vertebral arteries are patent with antegrade flow.   CXR   01/06/2013 1. Persistent findings of chronic pulmonary venous congestion  without definite acute cardiopulmonary disease on this AP portable  examination.  2. Grossly unchanged findings of a cardiomegaly, hypoventilation and  left basilar/ retrocardiac opacities, stable since the 02/2010  examination and thus favored to represent atelectasis or scar.   EKG Afib VR 82 BPM  Therapy Recommendations skilled nursing facility recommended Filed Vitals:   01/10/13 1600  BP: 158/85  Pulse: 110  Temp:   Resp: 30    Physical  Exam    . Afebrile. Head is traumatic with left periorbital ecchymoses. Neck is supple without bruit.   . Cardiac exam no murmur or gallop. Lungs are clear to auscultation. Distal pulses are well felt. Neurologic Examination  Mental Status:  The patient is lying in bed with eyes open. With deep sternal rub the patient groans and localizes to pain with both upper extremities. She follows no commands.  Cranial Nerves:  II: Pupils 3mm, round and briskly reactive bilaterally. There is no blink to threat bilaterally. The left orbit is ecchymotic.  III,IV, VI: No response to oculocephalic maneuvers  V,VII: Corneals intact bilaterally  VIII: Could not be  tested  IX,X: gag reflex is present XI: Shoulder shrug could not be tested  XII: The patient does not protrude her tongue  Motor:  Patient lifts both upper extremities off bed in response to pain. Movement noted in the right lower extremity as well. Only minimal LLE movement noted.  Sensory: Patient responds to noxious stimuli in all extremities.  Deep Tendon Reflexes: 2+ in the upper extremities, 1+ at the knees and absent at ankles  Plantars:  Right: Upgoing Left: Upgoing  Cerebellar:  Could not be tested  Gait: Could not be tested   ASSESSMENT Anna House is a 79 y.o. female presenting with LOC after falling while ambulating with her walker. TPA was not indicated secondary to hemorrhage. An MRI revealed a hemorrhagic infarct involving the superior right cerebellum with mass effect on the 4th ventricle but no hydrocephalus. Hemorrhage felt to be secondary to trauma after the patient fell at home while ambulating with her walker. On no antithrombotics prior to admission. Now on no antithrombotics for secondary stroke prevention. Patient with resultant decreased level of responsiveness.. Work up underway.   Fractured left orbit secondary to falling prior to admission  Atrial fibrillation with rapid ventricular response  Acute  encephalopathy  NPO - Jevity tube feedings.  Hypertonic saline to be discontinued.  Hospital day # 7  TREATMENT/PLAN  Continue no antithrombotics for secondary stroke prevention secondary to hemorrhage.  Goal SBP <160  Repeat Head CT today - no significant change  Hypertonic saline to be discontinued. Central line was placed.  Therapists recommend skilled nursing facility placement  2-D echo pending  Discussed with with Dr. Newell Coral - no surgery planned.  EEG - TCDs.  Delton See PA-C [facility] Pager [phone] 01/10/2013, 9:46 AM This patient is critically ill and at significant risk of neurological worsening, death and care requires constant monitoring of vital signs, hemodynamics,respiratory and cardiac monitoring,review of multiple databases, neurological assessment, discussion with family, other specialists and medical decision making of high complexity. I spent 30 minutes of neurocritical care time  in the care of  this patient. I have personally examined this patient, reviewed pertinent data, the plan of care and agree with the above Delia Heady, MD

## 2013-01-10 NOTE — Consult Note (Addendum)
Name: Anna House MRN: [id number] DOB: [birth year]    ADMISSION DATE:  01/02/2013 CONSULTATION DATE:  01/09/13  REFERRING MD :  [facility]  PRIMARY SERVICE: PCCM   CHIEF COMPLAINT:    BRIEF PATIENT DESCRIPTION: 79 yo WF multiple medical comorbidities, including hypertension, atrial fibrillation, cardiomyopathy (EF 20-25%), and pulmonary hypertension, admitted 12/15 after having fallen at home with altered mental status. Worsening mental status with MRI brain showing a right SCA CVA. That CVA, which at admission was not hemorrhagic, has evolved into a hemorrhagic CVA complicated by hypernatremia . Pt transferred to ICU , PCCM to assume care 12/21.   SIGNIFICANT EVENTS / STUDIES:  MRI 12/20 Hemorrhagic infarct involving the superior right cerebellum with mass effect on the 4th ventricle but no hydrocephalus. . Multiple punctate areas susceptibility compatible with remote  blood products suggest a diffuse vasculitis, specifically amyloid angiopathy.. Diffuse atrophy and white matter disease. This likely reflects the sequela of chronic microvascular ischemia.   Posttraumatic changes of the left maxilla are again noted. 01/09/13 Transfer to NICU for CVA with worsening mentation  12/21 Carotid Dopplers 1-39% stenosis , vertebral patent  12/21 3% started, line  LINES / TUBES: 12/21 IJ >>  CULTURES:  ANTIBIOTICS:  SUBJECTIVE:  Tolerated 3%, improved neurostatus  VITAL SIGNS: Temp:  [96.3 F (35.7 C)-98.5 F (36.9 C)] 98.5 F (36.9 C) (12/22 0734) Pulse Rate:  [44-139] 53 (12/22 0800) Resp:  [21-30] 28 (12/22 0800) BP: (110-179)/(65-123) 136/80 mmHg (12/22 0800) SpO2:  [92 %-99 %] 96 % (12/22 0800) HEMODYNAMICS: CVP:  [7 mmHg-16 mmHg] 12 mmHg VENTILATOR SETTINGS:   INTAKE / OUTPUT: Intake/Output     12/21 0701 - 12/22 0700 12/22 0701 - 12/23 0700   I.V. (mL/kg) 1137 (20.1) 40 (0.7)   Other 60    NG/GT 1318.3    Total Intake(mL/kg) 2515.3 (44.4) 40 (0.7)   Urine (mL/kg/hr)  570 (0.4) 95 (0.5)   Total Output 570 95   Net +1945.3 -55          PHYSICAL EXAMINATION: General:  Elderly , frail ,calm Neuro:  Gag cough wnl, calmer HEENT:  Dry mucosa  Cardiovascular:  ST , no m/r/g  Lungs:  Diminshed BS in bases, coarse apical  Abdomen:  Soft , NT , BS hypoactive  Musculoskeletal:  Intact , moves all extremities  Skin:  Intact   LABS:  CBC  Recent Labs Lab 01/05/13 0435 01/09/13 0510 01/10/13 0450  WBC 12.4* 8.3 7.2  HGB 12.1 10.5* 9.9*  HCT 39.4 35.3* 33.8*  PLT 279 244 211   Coag's  Recent Labs Lab 01/09/13 2218  INR 1.23   BMET  Recent Labs Lab 01/08/13 0535 01/09/13 0510  01/09/13 1850 01/10/13 0105 01/10/13 0450  NA 152* 151*  < > 153* 155* 158*  K 3.8 3.7  --   --   --  3.7  CL 112 112  --   --   --  121*  CO2 33* 33*  --   --   --  34*  BUN 18 22  --   --   --  22  CREATININE 0.58 0.64  --   --   --  0.56  GLUCOSE 164* 145*  --   --   --  134*  < > = values in this interval not displayed. Electrolytes  Recent Labs Lab 01/08/13 0535 01/09/13 0510 01/10/13 0450  CALCIUM 9.1 8.7 8.6   Sepsis Markers No results found for this basename: LATICACIDVEN,  PROCALCITON, O2SATVEN,  in the last 168 hours ABG  Recent Labs Lab 01/15/2013 1319 01/09/13 1538  PHART 7.411 7.493*  PCO2ART 47.8* 44.3  PO2ART 145.0* 71.0*   Liver Enzymes  Recent Labs Lab 01/04/13 0520 01/10/13 0450  AST 15 35  ALT 6 21  ALKPHOS 76 72  BILITOT 0.4 0.7  ALBUMIN 3.2* 2.4*   Cardiac Enzymes No results found for this basename: TROPONINI, PROBNP,  in the last 168 hours Glucose  Recent Labs Lab 01/09/13 0007 01/09/13 0748 01/09/13 1938 01/10/13 0041 01/10/13 0427 01/10/13 0732  GLUCAP 123* 125* 132* 123* 126* 136*    Imaging Ct Head Wo Contrast  01/10/2013   CLINICAL DATA:  Followup intracranial hemorrhage.  EXAM: CT HEAD WITHOUT CONTRAST  TECHNIQUE: Contiguous axial images were obtained from the base of the skull through the  vertex without intravenous contrast.  COMPARISON:  Prior MRI from 01/08/2013  FINDINGS: Previously identified infarct involving the right cerebellum is similar in size measuring approximately and 3.9 x 4.7 cm. A few serpiginous areas of hyperdensity within the infarcted area likely represent small amount of blood. Overall, finding is unchanged relative to previous MRI. Again, the infarct extends into the vermis There is similar mass effect on the adjacent 4th ventricle. Overall ventricle size is stable as compared to the previous examination without evidence of hydrocephalus.  No new hemorrhage identified. No mass or midline shift. Atrophy with chronic microvascular ischemic changes again noted. No extra-axial fluid collection.  Calvarium is intact. Left orbital floor fracture again noted. Previous identified extra conal intraorbital hemorrhage is grossly stable.  An enteric tube is in place.  Sequelae of prior bilateral mastoidectomies are noted.  IMPRESSION: 1. Similar size and distribution of right cerebellar infarct with similar mass effect on the adjacent 4th ventricle. No evidence of hydrocephalus or new intracranial hemorrhage identified. 2. Grossly stable left orbital floor fracture. 3. Atrophy with chronic microvascular ischemic disease.   Electronically Signed   By: Rise Mu M.D.   On: 01/10/2013 06:00   Mr Brain Wo Contrast  01/08/2013   CLINICAL DATA:  Altered mental status. Status post fall. Facial fractures.  EXAM: MRI HEAD WITHOUT CONTRAST  TECHNIQUE: Multiplanar, multiecho pulse sequences of the brain and surrounding structures were obtained without intravenous contrast.  COMPARISON:  CT head and face 01/11/2013.  FINDINGS: An acute/ subacute hemorrhagic right superior cerebellar infarct is evident. There is mass effect on the 4th ventricle compatible with interval edema. Infarct in hemorrhage extending to the vermis. Although there is partial effacement of the 4th ventricle, there is  no hydrocephalus. The ventricles are proportionate to the degree of atrophy without significant change.  Multiple other punctate its foci of susceptibility are evident throughout the supratentorial brain and basal ganglia. The  Atrophy and diffuse white matter disease are evident.  Flow is present in the major intracranial arteries. The patient is status post bilateral lens extractions. Posttraumatic changes of the left maxilla are better described on CT. The intraorbital extra-conal hemorrhage is again seen on the left. No new fractures are evident.  IMPRESSION: 1. Hemorrhagic infarct involving the superior right cerebellum with mass effect on the 4th ventricle but no hydrocephalus. 2. Multiple punctate areas susceptibility compatible with remote blood products suggest a diffuse vasculitis, specifically amyloid angiopathy. 3. Diffuse atrophy and white matter disease. This likely reflects the sequela of chronic microvascular ischemia. 4. Posttraumatic changes of the left maxilla are again noted.  These results were called by telephone at the time of interpretation  on 01/08/2013 at 7:45 PM to [place], RN 6 [place], who verbally acknowledged these results.   Electronically Signed   By: Gennette Pac M.D.   On: 01/08/2013 19:45   Dg Chest Port 1 View  01/10/2013   CLINICAL DATA:  Intubation.  EXAM: PORTABLE CHEST - 1 VIEW  COMPARISON:  01/09/2013.  FINDINGS: Right IJ line and feeding tube in stable position. Persistent cardiomegaly and pulmonary venous congestion. Bilateral interstitial prominence again noted. This has increased. Bilateral alveolar infiltrates are present . Small pleural effusions. These findings consistent with progressive congestive heart failure with pulmonary edema. No pneumothorax.  IMPRESSION: 1. Stable line and tube position. 2. Progressive congestive heart failure with pulmonary interstitial and alveolar edema. Bilateral small pleural effusions.   Electronically Signed   By: Maisie Fus  Register    On: 01/10/2013 07:47   Dg Chest Port 1 View  01/09/2013   CLINICAL DATA:  Central line placement.  EXAM: PORTABLE CHEST - 1 VIEW  COMPARISON:  01/06/2013.  FINDINGS: Right IJ line noted with its tip projected over the lower superior vena cava. Feeding tube noted with its tip below the left hemidiaphragm. Cardiomegaly is present with pulmonary venous congestion and bilateral interstitial and alveolar infiltrates consistent with congestive heart failure with pulmonary edema. Left pleural effusion cannot be excluded. No pneumothorax.  IMPRESSION: 1. Right IJ central line noted, its tip is in good anatomic position. Feeding tube noted with its tip below left hemidiaphragm. 2. Congestive heart failure with pulmonary edema and left-sided pleural effusion.   Electronically Signed   By: Maisie Fus  Register   On: 01/09/2013 15:56     CXR: chronic int changes, mild int edema increased  ASSESSMENT / PLAN:  PULMONARY A: Acute Resp Failure with hypoxia  NOT impressed asp process Chronic int changes edeam 12/22?  P:   Check cxr in am  Titrate o2 for sat >90%  May need diuresis, hold off as 3% just provided, but need to limit 3% with edema kvo  CARDIOVASCULAR A: Atrial Fib with RVR -on amio /dig at home pta  Chronic Systolic and Diastolic CHF EF per echo 2013   P:  Cardizem drip maintain, add oral to dc drip if able SCD  Not candidate for anticoagulation Avoid MAp less 65 cvp noted, kvo  RENAL A:  Hypernatremia, cerebellar bleed / cva P:   Concern to give further 3% with edema as well Chem in am Avoid any free water kvo May need lasix fi worsen rr  GASTROINTESTINAL A:  Dysphagia , high risk for aspiration  Hx of repaired cleft palate  Malnutrition   P:   Continue on TF Strict asp precautions  PPI   HEMATOLOGIC A:  Anemia   P:  Tr hbg , tranx for <7  scd Cbc in am after volume noted  INFECTIOUS A:  NOT impressed asp P:   Follow fever curve, pcxr in am   ENDOCRINE A:    Mild  Hyperglycemia on TF  Hypothryoid   P:   Add SSI sensitive  synthroid per tube   NEUROLOGIC A:  Right SCA CVA. That CVA, which had presentation was not hemorrhagic, has evolved into a hemorrhagic CVA per MRI  Let orbital Fx prior to admit - 12/21 NS consult - not surgical candidate   P:   Agree dc 3%  dnr Hob elevated  TODAY'S SUMMARY:  imporved after 3%, limit gross overload, continue tf, may need lasix   I have personally obtained a history, examined the  patient, evaluated laboratory and imaging results, formulated the assessment and plan and placed orders. CRITICAL CARE: The patient is critically ill with multiple organ systems failure and requires high complexity decision making for assessment and support, frequent evaluation and titration of therapies, application of advanced monitoring technologies and extensive interpretation of multiple databases. Critical Care Time devoted to patient care services described in this note is 30 minutes.   Ccm time 30 min   Mcarthur Rossetti. [name], MD, FACP Pgr: [phone] [facility]

## 2013-01-10 NOTE — Progress Notes (Signed)
Physical Therapy Treatment Patient Details Name: Anna House MRN: [id number] DOB: Oct 07, 1933 Today's Date: 01/10/2013 Time: 4098-1191 PT Time Calculation (min): 20 min  PT Assessment / Plan / Recommendation  History of Present Illness Pt admit after fall at home with concussion and AMS as well as left orbital fracture.     PT Comments   Pt con't to have minimal active participation in PT. Pt did open eyes and maintain eye opening once pt was sitting EOB. Pt con't to have no command follow or initiation to any mvmt. Acute PT to con't to follow to progress mobility as able.   Follow Up Recommendations  SNF;Supervision/Assistance - 24 hour     Does the patient have the potential to tolerate intense rehabilitation     Barriers to Discharge        Equipment Recommendations       Recommendations for Other Services    Frequency Min 3X/week   Progress towards PT Goals Progress towards PT goals: Not progressing toward goals - comment (pt with no increase in active participation)  Plan Current plan remains appropriate    Precautions / Restrictions Precautions Precautions: Fall Restrictions Weight Bearing Restrictions: No   Pertinent Vitals/Pain Pt didn't appear to have pain    Mobility  Bed Mobility Bed Mobility: Supine to Sit;Sit to Supine Supine to Sit: 1: +2 Total assist;HOB elevated Supine to Sit: Patient Percentage: 10% Sitting - Scoot to Edge of Bed: 1: +2 Total assist Sitting - Scoot to Edge of Bed: Patient Percentage: 0% Sit to Supine: 1: +2 Total assist Sit to Supine: Patient Percentage: 10% Details for Bed Mobility Assistance: pt with minimal assist, once in proper position pt able to maintain sitting EOB balance x 5 sec prior to posterior lean Transfers Transfers: Sit to Stand Sit to Stand: 1: +2 Total assist;From bed Sit to Stand: Patient Percentage: 10% Details for Transfer Assistance: attempted x 2, pt was able to WB thru bilat  LEs Ambulation/Gait Ambulation/Gait Assistance: Not tested (comment)    Exercises     PT Diagnosis:    PT Problem List:   PT Treatment Interventions:     PT Goals (current goals can now be found in the care plan section) Acute Rehab PT Goals Patient Stated Goal: didn't state  Visit Information  Last PT Received On: 01/10/13 Assistance Needed: +2 History of Present Illness: Pt admit after fall at home with concussion and AMS as well as left orbital fracture.      Subjective Data  Subjective: Pt groaning with mvmt but not clear Patient Stated Goal: didn't state   Cognition  Cognition Arousal/Alertness: Lethargic Behavior During Therapy: Flat affect Overall Cognitive Status: Difficult to assess    Balance  Balance Balance Assessed: Yes Static Sitting Balance Static Sitting - Balance Support: No upper extremity supported;Feet supported (pt did not use UEs to assist despite tactile and verbal cues) Static Sitting - Level of Assistance: 2: Max assist Static Sitting - Comment/# of Minutes: pt with strong posterior lean  End of Session PT - End of Session Equipment Utilized During Treatment: Gait belt;Oxygen Activity Tolerance: Patient limited by fatigue Patient left: in bed;with call bell/phone within reach Nurse Communication: Mobility status   GP     Marcene Brawn 01/10/2013, 3:53 PM  Lewis Shock, PT, DPT Pager #: [phone] Office #: [phone]

## 2013-01-10 NOTE — Progress Notes (Signed)
Echocardiogram 2D Echocardiogram has been performed.  Anna House 01/10/2013, 9:50 AM

## 2013-01-10 NOTE — Clinical Social Work Note (Signed)
Clinical Social Worker continuing to follow patient and patient family for support and discharge planning needs.  Per previous notes, patient's family has a preference for [facility]. Second choice is [facility]. CSW will follow up with patient's family regarding bed offers.  Patient currently remains in the ICU and not medically stable for discharge.  Anna House, [place] [phone]

## 2013-01-10 NOTE — Progress Notes (Signed)
EEG completed; results pending.    

## 2013-01-10 NOTE — Progress Notes (Signed)
Occupational Therapy Treatment Patient Details Name: Anna House MRN: [id number] DOB: July 12, 1933 Today's Date: 01/10/2013 Time: 4098-1191 OT Time Calculation (min): 16 min  OT Assessment / Plan / Recommendation  History of present illness Pt admit after fall at home with concussion and AMS as well as left orbital fracture.  She struck the front of her head and subsequently had LOC.  After EMS arrived she had 1 episode of vomiting while unconscious but seemed to guard her airway. However later found to have aspiration pneumonitis. After arrival to the ED she remained unconscious though with purposeful movements, neurology was curb sided and felt this was likely a complicated concussion.  She finally woke up during my admission evaluation some 4 hours or so after the fall. Did not speak well PTA due to a cleft palate. CT pelvis with findings suspicious for AVN right hip, pt with chronic hip pain with ambulation pre admit and suspect AVN etiology. 01/08/13 Hemorrhagic infarct involving the superior right cerebellum with mass effect on the 4th ventricle but no hydrocephalus. Multiple punctate areas susceptibility compatible with remote blood products suggest a diffuse vasculitis, specifically amyloid angiopathy. Diffuse atrophy and white matter disease. This likely reflects the sequela of chronic microvascular ischemia.Posttraumatic changes of the left maxilla are again noted, no role for neurosurgical intervention at this time   OT comments  This 79 yo female admitted and progressed with above presents to acute OT not currently making progress due to newly found CVA and not following commands. Will continue to follow pt to see if gains can be made.  Follow Up Recommendations  SNF             Frequency Min 2X/week   Progress towards OT Goals Progress towards OT goals: Not progressing toward goals - comment (due to not able to follow commands)  Plan Discharge plan remains appropriate     Precautions / Restrictions Precautions Precautions: Fall Restrictions Weight Bearing Restrictions: No       ADL  ADL Comments: Pt initially presenting with eyes closed, but then about 2 minutes into session she opened eyes (not to command) and they remained open until I left. No blink to threat. No following of commands to move arms, head, open/close eyes. Checked her PROM BIL UEs and it remains WNL with  mild "catch" with quick elbow flexion/extenison Bil (but not felt when going slowly). Pt's head in midline upon entering, rotated neck left and right only going about 1/4 range past midline. When removed pillow and asked her to keep her head up, it just slowly went into extension back to the bed. No family present at this time due to "quiet hours"      OT Goals(current goals can now be found in the care plan section) Acute Rehab OT Goals Patient Stated Goal: didn't state  Visit Information  Last OT Received On: 01/10/13 Assistance Needed: +2 (for OOB) History of Present Illness: Pt admit after fall at home with concussion and AMS as well as left orbital fracture.  She struck the front of her head and subsequently had LOC.  After EMS arrived she had 1 episode of vomiting while unconscious but seemed to guard her airway. However later found to have aspiration pneumonitis. After arrival to the ED she remained unconscious though with purposeful movements, neurology was curb sided and felt this was likely a complicated concussion.  She finally woke up during my admission evaluation some 4 hours or so after the fall. Did not speak well  PTA due to a cleft palate. CT pelvis with findings suspicious for AVN right hip, pt with chronic hip pain with ambulation pre admit and suspect AVN etiology. 01/08/13 Hemorrhagic infarct involving the superior right cerebellum with mass effect on the 4th ventricle but no hydrocephalus. Multiple punctate areas susceptibility compatible with remote blood products suggest  a diffuse vasculitis, specifically amyloid angiopathy. Diffuse atrophy and white matter disease. This likely reflects the sequela of chronic microvascular ischemia.Posttraumatic changes of the left maxilla are again noted, no role for neurosurgical intervention at this time          Cognition  Cognition Arousal/Alertness: Lethargic Behavior During Therapy: Flat affect Overall Cognitive Status: Difficult to assess (due to non-verbal and not following commnads)             End of Session OT - End of Session Activity Tolerance: Patient limited by lethargy;Patient limited by fatigue Patient left: in bed       Evette Georges [phone] 01/10/2013, 4:17 PM

## 2013-01-11 ENCOUNTER — Inpatient Hospital Stay (HOSPITAL_COMMUNITY): Payer: Medicare Other

## 2013-01-11 LAB — BASIC METABOLIC PANEL
BUN: 23 mg/dL (ref 6–23)
BUN: 23 mg/dL (ref 6–23)
Calcium: 8.8 mg/dL (ref 8.4–10.5)
Calcium: 8.7 mg/dL (ref 8.4–10.5)
Calcium: 8.5 mg/dL (ref 8.4–10.5)
Chloride: 122 mEq/L — ABNORMAL HIGH (ref 96–112)
Chloride: 117 mEq/L — ABNORMAL HIGH (ref 96–112)
Creatinine, Ser: 0.73 mg/dL (ref 0.50–1.10)
Creatinine, Ser: 0.71 mg/dL (ref 0.50–1.10)
GFR calc Af Amer: >90 mL/min (ref >90)
GFR calc Af Amer: >90 mL/min (ref >90)
GFR calc Af Amer: >90 mL/min (ref >90)
GFR calc non Af Amer: 80 mL/min — ABNORMAL LOW (ref >90)
GFR calc non Af Amer: 81 mL/min — ABNORMAL LOW (ref >90)
GFR calc non Af Amer: 79 mL/min — ABNORMAL LOW (ref >90)
Glucose, Bld: 135 mg/dL — ABNORMAL HIGH (ref 70–99)
Potassium: 4.3 mEq/L (ref 3.5–5.1)
Potassium: 4.2 mEq/L (ref 3.5–5.1)
Sodium: 163 mEq/L (ref 135–145)

## 2013-01-11 LAB — CBC WITH DIFFERENTIAL/PLATELET
Basophils Absolute: 0.1 10*3/uL (ref 0.0–0.1)
Basophils Relative: 1 % (ref 0–1)
HCT: 35.2 % — ABNORMAL LOW (ref 36.0–46.0)
Lymphocytes Relative: 10 % — ABNORMAL LOW (ref 12–46)
MCH: 28.4 pg (ref 26.0–34.0)
MCHC: 29.3 g/dL — ABNORMAL LOW (ref 30.0–36.0)
Monocytes Absolute: 0.7 10*3/uL (ref 0.1–1.0)
Neutro Abs: 7.5 10*3/uL (ref 1.7–7.7)
Platelets: 249 10*3/uL (ref 150–400)
RDW: 15 % (ref 11.5–15.5)
WBC: 9.3 10*3/uL (ref 4.0–10.5)

## 2013-01-11 LAB — GLUCOSE, CAPILLARY
Glucose-Capillary: 129 mg/dL — ABNORMAL HIGH (ref 70–99)
Glucose-Capillary: 135 mg/dL — ABNORMAL HIGH (ref 70–99)
Glucose-Capillary: 149 mg/dL — ABNORMAL HIGH (ref 70–99)
Glucose-Capillary: 140 mg/dL — ABNORMAL HIGH (ref 70–99)

## 2013-01-11 LAB — TSH: TSH: 1.512 u[IU]/mL (ref 0.350–4.500)

## 2013-01-11 LAB — SODIUM: Sodium: 158 mEq/L — ABNORMAL HIGH (ref 135–145)

## 2013-01-11 LAB — PHOSPHORUS: Phosphorus: 3.6 mg/dL (ref 2.3–4.6)

## 2013-01-11 MED ORDER — ENOXAPARIN SODIUM 40 MG/0.4ML ~~LOC~~ SOLN
40.0000 mg | SUBCUTANEOUS | Status: DC
  Administered 2013-01-11 – 2013-01-12 (×2): 40 mg via SUBCUTANEOUS
  Filled 2013-01-11 (×2): qty 0.4

## 2013-01-11 MED ORDER — METOPROLOL TARTRATE 25 MG/10 ML ORAL SUSPENSION
25.0000 mg | Freq: Three times a day (TID) | ORAL | Status: DC
  Administered 2013-01-11 – 2013-01-12 (×3): 25 mg
  Filled 2013-01-11 (×6): qty 10

## 2013-01-11 MED ORDER — AMIODARONE LOAD VIA INFUSION
150.0000 mg | Freq: Once | INTRAVENOUS | Status: AC
  Administered 2013-01-11: 150 mg via INTRAVENOUS
  Filled 2013-01-11: qty 83.34

## 2013-01-11 MED ORDER — LACTULOSE 10 GM/15ML PO SOLN
20.0000 g | Freq: Two times a day (BID) | ORAL | Status: DC
  Administered 2013-01-11 – 2013-01-12 (×3): 20 g via ORAL
  Filled 2013-01-11 (×4): qty 30

## 2013-01-11 MED ORDER — AMIODARONE IV BOLUS ONLY 150 MG/100ML
150.0000 mg | Freq: Once | INTRAVENOUS | Status: AC
  Administered 2013-01-11: 150 mg via INTRAVENOUS
  Filled 2013-01-11: qty 100

## 2013-01-11 MED ORDER — AMIODARONE HCL IN DEXTROSE 360-4.14 MG/200ML-% IV SOLN
60.0000 mg/h | INTRAVENOUS | Status: AC
  Administered 2013-01-11 (×2): 60 mg/h via INTRAVENOUS
  Filled 2013-01-11 (×2): qty 200

## 2013-01-11 MED ORDER — SODIUM CHLORIDE 0.45 % IV SOLN
INTRAVENOUS | Status: DC
  Administered 2013-01-11: 10:00:00 via INTRAVENOUS
  Administered 2013-01-12: 40 mL/h via INTRAVENOUS

## 2013-01-11 MED ORDER — METOPROLOL TARTRATE 1 MG/ML IV SOLN
2.5000 mg | INTRAVENOUS | Status: DC | PRN
  Administered 2013-01-11 (×2): 5 mg via INTRAVENOUS
  Filled 2013-01-11: qty 5

## 2013-01-11 MED ORDER — FREE WATER
200.0000 mL | Freq: Three times a day (TID) | Status: DC
  Administered 2013-01-11 (×3): 200 mL

## 2013-01-11 MED ORDER — DOCUSATE SODIUM 50 MG/5ML PO LIQD
100.0000 mg | Freq: Two times a day (BID) | ORAL | Status: DC
  Administered 2013-01-11 – 2013-01-12 (×3): 100 mg via ORAL
  Filled 2013-01-11 (×4): qty 10

## 2013-01-11 MED ORDER — AMIODARONE HCL IN DEXTROSE 360-4.14 MG/200ML-% IV SOLN
60.0000 mg/h | INTRAVENOUS | Status: DC
  Administered 2013-01-11 – 2013-01-12 (×3): 60 mg/h via INTRAVENOUS
  Filled 2013-01-11 (×8): qty 200

## 2013-01-11 NOTE — Progress Notes (Signed)
SLP Cancellation Note  Patient Details Name: Anna House MRN: [id number] DOB: 03/25/1933   Cancelled treatment:        Unable to assess po readiness due to decreased responsiveness.  RN reports pt is not sufficiently alert to attempt po trials.  Will continue efforts.  Celia B. Murvin Natal [facility], CCC-SLP [phone]  Leigh Aurora 01/11/2013, 10:09 AM

## 2013-01-11 NOTE — Progress Notes (Signed)
Na: 163. Dr. Pearlean Brownie made aware. Plan to order free water. Will continue to monitor.

## 2013-01-11 NOTE — Progress Notes (Signed)
Stroke Team Progress Note  HISTORY Anna House is a 79 y.o. female who experienced a witnessed fall while ambulating with her walker at home. The patient struck the front of her head with loss of consciousness. EMS was summoned and the patient was brought to the emergency department. Apparently she had an episode of vomiting while semi conscious. She woke up briefly while in the emergency room and was noted to be partially oriented with the ability to follow some commands. A CT scan showed no acute intracranial injury. She was noted to have a left ORBIT fracture as a result of the fall. Over the next several days she remained lethargic although she was oriented and was able to move all of her extremities. According to his chart there were no obvious neurologic deficits. She developed respiratory distress and was felt to have aspiration pneumonia. A feeding tube was placed. On 01/07/2013 she was noted to be more alert but still lethargic. On 01/08/2013 a decision was made to obtain a neurology consult and an MRI. The MRI revealed a hemorrhagic infarct involving the superior right cerebellum with a mass effect on the fourth ventricle but no hydrocephalus. Dr. Cyril Mourning was contacted as well as Dr. Newell Coral. Surgical intervention was not felt to be indicated. The patient will be moved to the neuro intensive care unit.   SUBJECTIVE No family members present. The patient appears unchanged.   OBJECTIVE Most recent Vital Signs: Filed Vitals:   01/11/13 0600 01/11/13 0700 01/11/13 0800 01/11/13 0900  BP: 127/88 136/100 151/107 152/112  Pulse: 92 117 83 91  Temp:  97.8 F (36.6 C)    TempSrc:  Axillary    Resp: 36 33 31 33  Height:      Weight:      SpO2: 95% 96% 96% 97%   CBG (last 3)   Recent Labs  01/10/13 2318 01/11/13 0322 01/11/13 0739  GLUCAP 129* 135* 149*    IV Fluid Intake:   . feeding supplement (JEVITY 1.2 CAL) 1,000 mL (01/11/13 0900)  . sodium chloride (hypertonic) Stopped  (01/10/13 0600)  . sodium chloride 0.9 % 1,000 mL infusion Stopped (01/09/13 1900)    MEDICATIONS  . antiseptic oral rinse  15 mL Mouth Rinse q12n4p  . chlorhexidine  15 mL Mouth Rinse BID  . diltiazem  60 mg Oral Q6H  . insulin aspart  1-3 Units Subcutaneous Q4H  . levothyroxine  100 mcg Per Tube QAC breakfast  . metoprolol  5 mg Intravenous Q6H  . pantoprazole sodium  40 mg Per Tube Daily  . sodium chloride  3 mL Intravenous Q12H   PRN:  acetaminophen, ondansetron (ZOFRAN) IV  Diet:  NPO no liquids. Jevity tube feedings. Activity:  Up with assistance DVT Prophylaxis:  SCDs  CLINICALLY SIGNIFICANT STUDIES Basic Metabolic Panel:   Recent Labs Lab 01/10/13 0450  01/10/13 2330 01/11/13 0515  NA 158*  < > 158* 163*  K 3.7  --   --  4.2  CL 121*  --   --  122*  CO2 34*  --   --  33*  GLUCOSE 134*  --   --  139*  BUN 22  --   --  23  CREATININE 0.56  --   --  0.73  CALCIUM 8.6  --   --  8.8  MG  --   --   --  2.5  PHOS  --   --   --  3.6  < > = values  in this interval not displayed. Liver Function Tests:   Recent Labs Lab 01/10/13 0450  AST 35  ALT 21  ALKPHOS 72  BILITOT 0.7  PROT 5.8*  ALBUMIN 2.4*   CBC:   Recent Labs Lab 01/10/13 0450 01/11/13 0515  WBC 7.2 9.3  NEUTROABS  --  7.5  HGB 9.9* 10.3*  HCT 33.8* 35.2*  MCV 96.8 97.0  PLT 211 249   Coagulation:   Recent Labs Lab 01/09/13 2218  LABPROT 15.2  INR 1.23   Cardiac Enzymes:  No results found for this basename: CKTOTAL, CKMB, CKMBINDEX, TROPONINI,  in the last 168 hours Urinalysis:  No results found for this basename: COLORURINE, APPERANCEUR, LABSPEC, PHURINE, GLUCOSEU, HGBUR, BILIRUBINUR, KETONESUR, PROTEINUR, UROBILINOGEN, NITRITE, LEUKOCYTESUR,  in the last 168 hours Lipid Panel    Component Value Date/Time   CHOL 170 01/09/2013 0510   HgbA1C  No results found for this basename: HGBA1C    Urine Drug Screen:   No results found for this basename: labopia,  cocainscrnur,   labbenz,  amphetmu,  thcu,  labbarb    Alcohol Level: No results found for this basename: ETH,  in the last 168 hours  Mr Brain Wo Contrast  01/08/2013    1. Hemorrhagic infarct involving the superior right cerebellum with mass effect on the 4th ventricle but no hydrocephalus. 2. Multiple punctate areas susceptibility compatible with remote blood products suggest a diffuse vasculitis, specifically amyloid angiopathy. 3. Diffuse atrophy and white matter disease. This likely reflects the sequela of chronic microvascular ischemia. 4. Posttraumatic changes of the left maxilla are again noted.    CT of the brain   12/28/2012   1. No evidence of acute intracranial injury.  2. Negative for acute cervical spine fracture.  3. Left orbital floor fracture with thin extraconal hemorrhage that  does not cause proptosis.  4. Nondisplaced inferior left maxillary sinus fractures.  CT of brain 01/10/2013 1. Similar size and distribution of right cerebellar infarct with  similar mass effect on the adjacent 4th ventricle. No evidence of  hydrocephalus or new intracranial hemorrhage identified.  2. Grossly stable left orbital floor fracture.  3. Atrophy with chronic microvascular ischemic disease.  EEG - This is an abnormal EEG. The posterior background slowing noted during wakefulness may be an indicator of how difficult it is to arouse the patient fully or may represent a diffuse gray matter disturbance that is etiologically nonspecific but may include a metabolic encephalopathy among other possibilities. Although drowsiness exhibits abnormal characteristics no epileptiform activity is noted.    2D Echocardiogram  Systolic function was moderately to severely reduced. The estimated ejection fraction was in the range of 30% to 35%. Diffuse hypokinesis. There is severe hypokinesis of the entireanteroseptal myocardium. There is severe hypokinesis of the entireinferoseptal myocardium. Features are  consistent with a pseudonormal left ventricular filling pattern, with concomitant abnormal relaxation and increased filling pressure (grade 2 diastolic dysfunction).   Carotid Doppler  Findings suggest 1-39% internal carotid artery stenosis bilaterally. Vertebral arteries are patent with antegrade flow.   CXR   01/06/2013 1. Persistent findings of chronic pulmonary venous congestion  without definite acute cardiopulmonary disease on this AP portable  examination.  2. Grossly unchanged findings of a cardiomegaly, hypoventilation and  left basilar/ retrocardiac opacities, stable since the 02/2010  examination and thus favored to represent atelectasis or scar.   EKG Afib VR 82 BPM  Therapy Recommendations skilled nursing facility recommended Filed Vitals:   01/11/13 0900  BP: 152/112  Pulse: 91  Temp:   Resp: 33    Physical Exam    . Afebrile. Head is traumatic with left periorbital ecchymoses. Neck is supple without bruit.   . Cardiac exam no murmur or gallop. Lungs are clear to auscultation. Distal pulses are well felt. Neurologic Examination  Mental Status:  The patient is lying in bed with eyes open. With deep sternal rub the patient groans and localizes to pain with both upper extremities. She follows no commands.  Cranial Nerves:  II: Pupils 3mm, round and briskly reactive bilaterally. There is no blink to threat bilaterally. The left orbit is ecchymotic.  III,IV, VI: No response to oculocephalic maneuvers  V,VII: Corneals intact bilaterally  VIII: Could not be tested  IX,X: gag reflex is present XI: Shoulder shrug could not be tested  XII: The patient does not protrude her tongue  Motor:  Patient lifts both upper extremities off bed in response to pain. Movement noted in the right lower extremity as well. Only minimal LLE movement noted.  Sensory: Patient responds to noxious stimuli in all extremities.  Deep Tendon Reflexes: 2+ in the upper extremities, 1+ at the  knees and absent at ankles  Plantars:  Right: Upgoing Left: Upgoing  Cerebellar:  Could not be tested  Gait: Could not be tested   ASSESSMENT Ms. Anna House is a 79 y.o. female presenting with LOC after falling while ambulating with her walker. TPA was not indicated secondary to hemorrhage. An MRI revealed a hemorrhagic infarct involving the superior right cerebellum with mass effect on the 4th ventricle but no hydrocephalus. Infarct felt to be secondary to atrial fibrillation with rauma after the patient fell at home while ambulating with her walker. On no antithrombotics prior to admission. Now on no antithrombotics for secondary stroke prevention. Patient with resultant decreased level of responsiveness.. Work up underway.   Fractured left orbit secondary to falling prior to admission  Atrial fibrillation with rapid ventricular response  Acute encephalopathy  NPO - Jevity tube feedings.  Hypertonic saline to be discontinued.  Hospital day # 8  TREATMENT/PLAN  Continue no antithrombotics for secondary stroke prevention secondary to hemorrhage.  Goal SBP <160  Repeat Head CT 01/10/13- no significant change  Hypertonic saline to be discontinued. Central line was placed.  Therapists recommend skilled nursing facility placement  Discussed with with Dr. Newell Coral - no surgery planned.  Add free water and 1/2 NS for persistent elevated serum sodium despite 3% saline DC   .D/W Dr Erick Alley, MD  01/11/2013, 9:08 AM This patient is critically ill and at significant risk of neurological worsening, death and care requires constant monitoring of vital signs, hemodynamics,respiratory and cardiac monitoring,review of multiple databases, neurological assessment, discussion with family, other specialists and medical decision making of high complexity. I spent 30 minutes of neurocritical care time  in the care of  this patient. I have personally examined this patient,  reviewed pertinent data, the plan of care and agree with the above Delia Heady, MD

## 2013-01-11 NOTE — Progress Notes (Signed)
Pt's heart rate increased to 150-160's. Notified E-link. PRN Lopressor ordered. Will continue to monitor.

## 2013-01-11 NOTE — Progress Notes (Addendum)
Transcranial Doppler  Date POD PCO2 HCT BP  MCA ACA PCA OPHT SIPH VERT Basilar  01/11/13 MS     Right  Left   *  22.7   *  *   *  *   19  26   *  *   -16  -25.4   *  -16.2         Right  Left                                            Right  Left                                             Right  Left                                             Right  Left                                            Right  Left                                            Right  Left                                        MCA = Middle Cerebral Artery      OPHT = Opthalmic Artery     BASILAR = Basilar Artery   ACA = Anterior Cerebral Artery     SIPH = Carotid Siphon PCA = Posterior Cerebral Artery   VERT = Verterbral Artery                   Normal MCA = 62+\-12 ACA = 50+\-12 PCA = 42+\-23    *Unable to insonate. Study was technically difficult and limited due to poor acoustic windows, poor patient cooperation, and atrial fibrillation.  01/11/2013 10:45 AM Gertie Fey, RVT, RDCS, RDMS  Lou Cal, RVT

## 2013-01-11 NOTE — Consult Note (Signed)
Name: Anna House MRN: [id number] DOB: 29-Sep-1933    ADMISSION DATE:  01-11-2013 CONSULTATION DATE:  01/09/13  REFERRING MD :  [facility]  PRIMARY SERVICE: PCCM   CHIEF COMPLAINT:    BRIEF PATIENT DESCRIPTION: 79 yo WF multiple medical comorbidities, including hypertension, atrial fibrillation, cardiomyopathy (EF 20-25%), and pulmonary hypertension, admitted 12/15 after having fallen at home with altered mental status. Worsening mental status with MRI brain showing a right SCA CVA. That CVA, which at admission was not hemorrhagic, has evolved into a hemorrhagic CVA complicated by hypernatremia . Pt transferred to ICU , PCCM to assume care 12/21.   SIGNIFICANT EVENTS / STUDIES:  MRI 12/20 Hemorrhagic infarct involving the superior right cerebellum with mass effect on the 4th ventricle but no hydrocephalus. . Multiple punctate areas susceptibility compatible with remote  blood products suggest a diffuse vasculitis, specifically amyloid angiopathy.. Diffuse atrophy and white matter disease. This likely reflects the sequela of chronic microvascular ischemia.   Posttraumatic changes of the left maxilla are again noted. 01/09/13 Transfer to NICU for CVA with worsening mentation  12/21 Carotid Dopplers 1-39% stenosis , vertebral patent  12/21 3% started, line  LINES / TUBES: 12/21 IJ >>  CULTURES:  ANTIBIOTICS:  SUBJECTIVE:  TNa riseing, remains tachy  VITAL SIGNS: Temp:  [97.4 F (36.3 C)-100.5 F (38.1 C)] 97.8 F (36.6 C) (12/23 0700) Pulse Rate:  [69-151] 91 (12/23 0900) Resp:  [21-36] 33 (12/23 0900) BP: (112-187)/(64-112) 152/112 mmHg (12/23 0900) SpO2:  [86 %-98 %] 97 % (12/23 0900) Weight:  [55.3 kg (121 lb 14.6 oz)-57.3 kg (126 lb 5.2 oz)] 55.3 kg (121 lb 14.6 oz) (12/23 0500) HEMODYNAMICS: CVP:  [10 mmHg-18 mmHg] 15 mmHg VENTILATOR SETTINGS:   INTAKE / OUTPUT: Intake/Output     12/22 0701 - 12/23 0700 12/23 0701 - 12/24 0700   I.V. (mL/kg) 105 (1.9)    Other      NG/GT 1880 180   Total Intake(mL/kg) 1985 (35.9) 180 (3.3)   Urine (mL/kg/hr) 660 (0.5) 60 (0.5)   Total Output 660 60   Net +1325 +120          PHYSICAL EXAMINATION: General:  Elderly , frail ,calm Neuro:  Gag cough wnl, awakens HEENT:  Dry mucosa  Cardiovascular:  ST , no m/r/g  Lungs:  reduced Abdomen:  Soft , NT , BS normo Musculoskeletal:  Intact , moves all extremities  Skin:  Intact   LABS:  CBC  Recent Labs Lab 01/09/13 0510 01/10/13 0450 01/11/13 0515  WBC 8.3 7.2 9.3  HGB 10.5* 9.9* 10.3*  HCT 35.3* 33.8* 35.2*  PLT 244 211 249   Coag's  Recent Labs Lab 01/09/13 2218  INR 1.23   BMET  Recent Labs Lab 01/09/13 0510  01/10/13 0450  01/10/13 1700 01/10/13 2330 01/11/13 0515  NA 151*  < > 158*  < > 159* 158* 163*  K 3.7  --  3.7  --   --   --  4.2  CL 112  --  121*  --   --   --  122*  CO2 33*  --  34*  --   --   --  33*  BUN 22  --  22  --   --   --  23  CREATININE 0.64  --  0.56  --   --   --  0.73  GLUCOSE 145*  --  134*  --   --   --  139*  < > =  values in this interval not displayed. Electrolytes  Recent Labs Lab 01/09/13 0510 01/10/13 0450 01/11/13 0515  CALCIUM 8.7 8.6 8.8  MG  --   --  2.5  PHOS  --   --  3.6   Sepsis Markers No results found for this basename: LATICACIDVEN, PROCALCITON, O2SATVEN,  in the last 168 hours ABG  Recent Labs Lab 01/09/13 1538  PHART 7.493*  PCO2ART 44.3  PO2ART 71.0*   Liver Enzymes  Recent Labs Lab 01/10/13 0450  AST 35  ALT 21  ALKPHOS 72  BILITOT 0.7  ALBUMIN 2.4*   Cardiac Enzymes No results found for this basename: TROPONINI, PROBNP,  in the last 168 hours Glucose  Recent Labs Lab 01/10/13 1300 01/10/13 1612 01/10/13 2020 01/10/13 2318 01/11/13 0322 01/11/13 0739  GLUCAP 138* 122* 115* 129* 135* 149*    Imaging Ct Head Wo Contrast  01/10/2013   CLINICAL DATA:  Followup intracranial hemorrhage.  EXAM: CT HEAD WITHOUT CONTRAST  TECHNIQUE: Contiguous axial images  were obtained from the base of the skull through the vertex without intravenous contrast.  COMPARISON:  Prior MRI from 01/08/2013  FINDINGS: Previously identified infarct involving the right cerebellum is similar in size measuring approximately and 3.9 x 4.7 cm. A few serpiginous areas of hyperdensity within the infarcted area likely represent small amount of blood. Overall, finding is unchanged relative to previous MRI. Again, the infarct extends into the vermis There is similar mass effect on the adjacent 4th ventricle. Overall ventricle size is stable as compared to the previous examination without evidence of hydrocephalus.  No new hemorrhage identified. No mass or midline shift. Atrophy with chronic microvascular ischemic changes again noted. No extra-axial fluid collection.  Calvarium is intact. Left orbital floor fracture again noted. Previous identified extra conal intraorbital hemorrhage is grossly stable.  An enteric tube is in place.  Sequelae of prior bilateral mastoidectomies are noted.  IMPRESSION: 1. Similar size and distribution of right cerebellar infarct with similar mass effect on the adjacent 4th ventricle. No evidence of hydrocephalus or new intracranial hemorrhage identified. 2. Grossly stable left orbital floor fracture. 3. Atrophy with chronic microvascular ischemic disease.   Electronically Signed   By: Rise Mu M.D.   On: 01/10/2013 06:00   Dg Chest Port 1 View  01/11/2013   CLINICAL DATA:  Pulmonary edema.  EXAM: PORTABLE CHEST - 1 VIEW  COMPARISON:  01/10/2013 and 01/09/2013  FINDINGS: Feeding tube and central venous catheter appear in good position, unchanged. There has been a slight decrease in the bilateral pulmonary edema. Marked prominence of the cardiac silhouette and pulmonary vascular congestion persists. And/or effusion at the left base is unchanged.  IMPRESSION: Slight improvement in pulmonary edema.   Electronically Signed   By: Geanie Cooley M.D.   On:  01/11/2013 07:56   Dg Chest Port 1 View  01/10/2013   CLINICAL DATA:  Intubation.  EXAM: PORTABLE CHEST - 1 VIEW  COMPARISON:  01/09/2013.  FINDINGS: Right IJ line and feeding tube in stable position. Persistent cardiomegaly and pulmonary venous congestion. Bilateral interstitial prominence again noted. This has increased. Bilateral alveolar infiltrates are present . Small pleural effusions. These findings consistent with progressive congestive heart failure with pulmonary edema. No pneumothorax.  IMPRESSION: 1. Stable line and tube position. 2. Progressive congestive heart failure with pulmonary interstitial and alveolar edema. Bilateral small pleural effusions.   Electronically Signed   By: Maisie Fus  Register   On: 01/10/2013 07:47   Dg Chest Port 1 [address]  01/09/2013   CLINICAL DATA:  Central line placement.  EXAM: PORTABLE CHEST - 1 VIEW  COMPARISON:  01/06/2013.  FINDINGS: Right IJ line noted with its tip projected over the lower superior vena cava. Feeding tube noted with its tip below the left hemidiaphragm. Cardiomegaly is present with pulmonary venous congestion and bilateral interstitial and alveolar infiltrates consistent with congestive heart failure with pulmonary edema. Left pleural effusion cannot be excluded. No pneumothorax.  IMPRESSION: 1. Right IJ central line noted, its tip is in good anatomic position. Feeding tube noted with its tip below left hemidiaphragm. 2. Congestive heart failure with pulmonary edema and left-sided pleural effusion.   Electronically Signed   By: Maisie Fus  Register   On: 01/09/2013 15:56     CXR: chronic int changes, mild int edema about same  ASSESSMENT / PLAN:  PULMONARY A: Acute Resp Failure with hypoxia  NOT impressed asp process Chronic int changes edeam 12/22  P:   Need to slow HR , causing edema with rvr pcxr in am for edema No lasix with Na  CARDIOVASCULAR A: Atrial Fib with RVR worsening Chronic Systolic and Diastolic CHF EF per echo 2013    P:  start amio drip May in fact need also restart of Cardizem infusion SCD  Not candidate for anticoagulation Avoid MAp less 65 cvp 12  RENAL A:  Hypernatremia worsening, cerebellar bleed / cva P:   Chem in am and frequent Add free water kvo Add 1/2 NS at low rate as well  GASTROINTESTINAL A:  Dysphagia , high risk for aspiration  Hx of repaired cleft palate  Malnutrition   P:   Continue on TF Strict asp precautions  PPI   HEMATOLOGIC A:  Anemia   P:  scd  INFECTIOUS A:  NOT impressed asp, no evidence infection P:   Follow fever curve  ENDOCRINE A:   Mild  Hyperglycemia on TF  Hypothryoid   P:   Add SSI sensitive  synthroid per tube   NEUROLOGIC A:  Right SCA CVA. That CVA, which had presentation was not hemorrhagic, has evolved into a hemorrhagic CVA per MRI  Let orbital Fx prior to admit - 12/21 NS consult - not surgical candidate   P:   Need some correction na, slight dnr Hob elevated  TODAY'S SUMMARY:  Add amio, may need restart cardizem, correct na   I have personally obtained a history, examined the patient, evaluated laboratory and imaging results, formulated the assessment and plan and placed orders. CRITICAL CARE: The patient is critically ill with multiple organ systems failure and requires high complexity decision making for assessment and support, frequent evaluation and titration of therapies, application of advanced monitoring technologies and extensive interpretation of multiple databases. Critical Care Time devoted to patient care services described in this note is 30 minutes.   Ccm time 30 min   Mcarthur Rossetti. [name], MD, FACP Pgr: [phone] [facility]

## 2013-01-11 NOTE — Progress Notes (Signed)
CRITICAL VALUE ALERT  Critical value received:  Na+ 163  Date of notification:  01/11/13  Time of notification:  0620  Critical value read back:yes  Nurse who received alert:  Tedra Coupe, RN  MD notified (1st page):  Deterding  Time of first page:  0621  MD notified (2nd page):  Time of second page:  Responding MD:  Deterding  Time MD responded:  [phone]

## 2013-01-11 NOTE — Progress Notes (Addendum)
NUTRITION FOLLOW UP  Intervention:   1.  Continue Jevity 1.2 @ 50 mL/hr continuous. Provides 1440 kcal, 67g protein, 972 mL free water. 2.  Modify diet; resume PO diet once medically appropriate per SLP/MD discretion.  Nutrition Dx:   Inadequate oral intake, ongoing  Monitor:   1.  Enteral nutrition; initiation with tolerance.  Pt to meet >/=90% estimated needs with nutrition support, met.  2.  Wt/wt change; monitor trends 3.  Food/Beverage; resume of PO diet with pt meeting >/=90% estimated needs with tolerance.  Assessment:   Patient lives with sister and had a witnessed fall using her walker. Sustained concussion and orbital fracture.  Patient is unsafe for po until more regularly alert.  SLP recommended short-term nutrition support. Patient has a long standing cleft palate. Unable to reach family to obtain hx. Patient has had an 8% weight loss in the past 8 months. Currently with post-pyloric tube.   TF started 12/18 and is at goal rate.  Jevity 1.2 @ 50 ml/hr, providing 1440 kcal, 67 grams protein, and 972 ml H2O.   Pt transferred to 35M due to worsening mentation. Per MRI pt with CVA which has evolved into a hemorrhagic CVA. Per NS pt is not a surgical candidate so 3% started. Na now 163 so free water started.   No bm since admission, discussed with MD and RN during rounds.   Height: Ht Readings from Last 1 Encounters:  01/09/13 5\' 3"  (1.6 m)    Weight Status:   Wt Readings from Last 1 Encounters:  01/11/13 121 lb 14.6 oz (55.3 kg)  Admission weight 119 lb (54.4 kg)  Re-estimated needs:  Kcal: 1400-1600 Protein: 65-75g Fluid: >1.5 L/day  Skin: intact, bruising around eye  Diet Order: NPO   Intake/Output Summary (Last 24 hours) at 01/11/13 0905 Last data filed at 01/11/13 0900  Gross per 24 hour  Intake   2145 ml  Output    660 ml  Net   1485 ml    Last BM: PTA   Labs:   Recent Labs Lab 01/09/13 0510  01/10/13 0450  01/10/13 1700 01/10/13 2330  01/11/13 0515  NA 151*  < > 158*  < > 159* 158* 163*  K 3.7  --  3.7  --   --   --  4.2  CL 112  --  121*  --   --   --  122*  CO2 33*  --  34*  --   --   --  33*  BUN 22  --  22  --   --   --  23  CREATININE 0.64  --  0.56  --   --   --  0.73  CALCIUM 8.7  --  8.6  --   --   --  8.8  MG  --   --   --   --   --   --  2.5  PHOS  --   --   --   --   --   --  3.6  GLUCOSE 145*  --  134*  --   --   --  139*  < > = values in this interval not displayed.  CBG (last 3)   Recent Labs  01/10/13 2318 01/11/13 0322 01/11/13 0739  GLUCAP 129* 135* 149*    Scheduled Meds: . antiseptic oral rinse  15 mL Mouth Rinse q12n4p  . chlorhexidine  15 mL Mouth Rinse BID  . diltiazem  60 mg Oral Q6H  . insulin aspart  1-3 Units Subcutaneous Q4H  . levothyroxine  100 mcg Per Tube QAC breakfast  . metoprolol  5 mg Intravenous Q6H  . pantoprazole sodium  40 mg Per Tube Daily  . sodium chloride  3 mL Intravenous Q12H    Continuous Infusions: . feeding supplement (JEVITY 1.2 CAL) 1,000 mL (01/11/13 0900)  . sodium chloride (hypertonic) Stopped (01/10/13 0600)  . sodium chloride 0.9 % 1,000 mL infusion Stopped (01/09/13 1900)    Kendell Bane RD, LDN, CNSC [phone] Pager [phone] After Hours Pager

## 2013-01-12 LAB — BASIC METABOLIC PANEL
BUN: 22 mg/dL (ref 6–23)
Calcium: 8.6 mg/dL (ref 8.4–10.5)
Calcium: 8.3 mg/dL — ABNORMAL LOW (ref 8.4–10.5)
Creatinine, Ser: 0.86 mg/dL (ref 0.50–1.10)
GFR calc Af Amer: >90 mL/min (ref >90)
GFR calc Af Amer: 74 mL/min — ABNORMAL LOW (ref >90)
GFR calc non Af Amer: 79 mL/min — ABNORMAL LOW (ref >90)
GFR calc non Af Amer: 64 mL/min — ABNORMAL LOW (ref >90)
Glucose, Bld: 179 mg/dL — ABNORMAL HIGH (ref 70–99)
Glucose, Bld: 166 mg/dL — ABNORMAL HIGH (ref 70–99)
Sodium: 155 mEq/L — ABNORMAL HIGH (ref 135–145)
Sodium: 154 mEq/L — ABNORMAL HIGH (ref 135–145)

## 2013-01-12 LAB — HEPATIC FUNCTION PANEL
Albumin: 2.2 g/dL — ABNORMAL LOW (ref 3.5–5.2)
Bilirubin, Direct: 0.2 mg/dL (ref 0.0–0.3)
Indirect Bilirubin: 0.4 mg/dL (ref 0.3–0.9)
Total Bilirubin: 0.6 mg/dL (ref 0.3–1.2)

## 2013-01-12 LAB — GLUCOSE, CAPILLARY
Glucose-Capillary: 168 mg/dL — ABNORMAL HIGH (ref 70–99)
Glucose-Capillary: 157 mg/dL — ABNORMAL HIGH (ref 70–99)

## 2013-01-12 LAB — MAGNESIUM: Magnesium: 2.4 mg/dL (ref 1.5–2.5)

## 2013-01-12 LAB — SODIUM: Sodium: 155 mEq/L — ABNORMAL HIGH (ref 135–145)

## 2013-01-12 MED ORDER — MORPHINE SULFATE 2 MG/ML IJ SOLN
2.0000 mg | INTRAMUSCULAR | Status: DC | PRN
  Administered 2013-01-12: 2 mg via INTRAVENOUS
  Filled 2013-01-12: qty 1

## 2013-01-12 MED ORDER — MORPHINE SULFATE 10 MG/ML IJ SOLN
10.0000 mg/h | INTRAVENOUS | Status: DC
  Administered 2013-01-12: 10 mg/h via INTRAVENOUS
  Filled 2013-01-12: qty 10

## 2013-01-12 MED ORDER — METOPROLOL TARTRATE 25 MG/10 ML ORAL SUSPENSION
50.0000 mg | Freq: Three times a day (TID) | ORAL | Status: DC
  Filled 2013-01-12 (×2): qty 20

## 2013-01-12 MED ORDER — MORPHINE BOLUS VIA INFUSION
5.0000 mg | INTRAVENOUS | Status: DC | PRN
  Filled 2013-01-12: qty 20

## 2013-01-12 MED ORDER — SCOPOLAMINE 1 MG/3DAYS TD PT72
1.0000 | MEDICATED_PATCH | TRANSDERMAL | Status: DC
  Administered 2013-01-12: 1.5 mg via TRANSDERMAL
  Filled 2013-01-12: qty 1

## 2013-01-16 NOTE — Discharge Summary (Signed)
Anna House, Anna House                ACCOUNT NO.:  [id number]  MEDICAL RECORD NO.:  [id number]  LOCATION:  [id number]                        FACILITY:  [facility]  PHYSICIAN:  Nelda Bucks, MD DATE OF BIRTH:  22-Aug-1933  DATE OF ADMISSION:  January 28, 2013 DATE OF DISCHARGE:  01/16/2013                              DISCHARGE SUMMARY   DEATH SUMMARY  This is a 79 year old female, who suffered a witnessed fall while using her walker at home.  She struck her front of her head and had loss of consciousness.  EMS arrived, she had episode of vomiting and was unconscious.  Came to the Emergency Room at [facility] unconscious with purposeful movements.  Neurology was involved to some extent and was concerned about a concussion.  She has improved her neurologic status and her speech at that time.  MRI was recommended. She has known history of hypertension, atrial fibrillation, cardiomyopathy with EF of 20-25%, and pulmonary hypertension.  MRI of the brain showed right SCA CVA, which on admission was not hemorrhagic, had evolved into a hemorrhagic stroke complicated by hypernatremia.  The patient was transferred to the intensive care unit.  Again, MRI of the brain on January 08, 2013, showed superior right cerebellar hemorrhagic infarct with mass effect on the fourth ventricle, but no hydrocephalus. On January 09, 2013, she was transferred to the Neurointensive Care Unit with stroke or worsening mentation.  She has hypernatremia.  She had 3% saline provided and central line placed.  She had carotid Dopplers, which showed 1-39% stenosis, we have urged paint.  She also suffered for atrial fibrillation with RVR, for which, Cardizem drip was used and then amiodarone.  The patient had extensive family discussions, for which, was stated she would not want aggressive heroic measures including life support, aggressive measures in terms of medical therapy was provided.  The patient continues  to decline with the respiratory status, for which, she was provided comfort care with morphine and expired.  FINAL DIAGNOSES UPON DEATH: 1. Status post fall. 2. Superior right cerebellar hemorrhagic infarct. 3. Atrial fibrillation with rapid ventricular response. 4. Known congestive heart failure. 5. Chronic systolic and diastolic heart failure. 6. Hypernatremia.     Nelda Bucks, MD     DJF/MEDQ  D:  01/16/2013  T:  01/16/2013  Job:  [id number]

## 2013-01-20 NOTE — Progress Notes (Signed)
Pt. Passed @ 1631. Confirmed by 2 RNs: Vanna Scotland and Lance Bosch. Dr. Sung Amabile notified and okayed pronouncement by 2 RNs. Family at bedside including next of kin and HCPOA Alayjah Boehringer. Chaplain notified and came to assist the family. CDS called @ 1644 and pt. Declared not suitable for donation. Medical Examiner notified and declared pt. Not to be a ME case. Funeral Home information received from family, pt. To be transferred to the morgue.

## 2013-01-20 NOTE — Consult Note (Addendum)
Name: Anna House MRN: [id number] DOB: 1933-04-23    ADMISSION DATE:  01/19/2013 CONSULTATION DATE:  01/09/13  REFERRING MD :  [facility]  PRIMARY SERVICE: PCCM   CHIEF COMPLAINT:    BRIEF PATIENT DESCRIPTION: 80 yo WF multiple medical comorbidities, including hypertension, atrial fibrillation, cardiomyopathy (EF 20-25%), and pulmonary hypertension, admitted 12/15 after having fallen at home with altered mental status. Worsening mental status with MRI brain showing a right SCA CVA. That CVA, which at admission was not hemorrhagic, has evolved into a hemorrhagic CVA complicated by hypernatremia . Pt transferred to ICU , PCCM to assume care 12/21.   SIGNIFICANT EVENTS / STUDIES:  MRI 12/20 Hemorrhagic infarct involving the superior right cerebellum with mass effect on the 4th ventricle but no hydrocephalus. . Multiple punctate areas susceptibility compatible with remote  blood products suggest a diffuse vasculitis, specifically amyloid angiopathy.. Diffuse atrophy and white matter disease. This likely reflects the sequela of chronic microvascular ischemia.   Posttraumatic changes of the left maxilla are again noted. 01/09/13 Transfer to NICU for CVA with worsening mentation  12/21 Carotid Dopplers 1-39% stenosis , vertebral patent  12/21 3% started, line 12/23 amio remains, no cardizem  LINES / TUBES: 12/21 IJ >>  CULTURES:  ANTIBIOTICS:  SUBJECTIVE:  Rate controled now this am   VITAL SIGNS: Temp:  [97.4 F (36.3 C)-102.1 F (38.9 C)] 97.4 F (36.3 C) (12/24 0800) Pulse Rate:  [30-154] 80 (12/24 0900) Resp:  [25-57] 34 (12/24 0900) BP: (102-171)/(40-115) 126/40 mmHg (12/24 0900) SpO2:  [93 %-99 %] 96 % (12/24 0900) Weight:  [58.3 kg (128 lb 8.5 oz)] 58.3 kg (128 lb 8.5 oz) (12/24 0400) HEMODYNAMICS: CVP:  [8 mmHg-20 mmHg] 8 mmHg VENTILATOR SETTINGS:   INTAKE / OUTPUT: Intake/Output     12/23 0701 - 12/24 0700 12/24 0701 - 12/25 0700   I.V. (mL/kg) 2109.9 (36.2) 146.6  (2.5)   NG/GT 1910 100   Total Intake(mL/kg) 4019.9 (69) 246.6 (4.2)   Urine (mL/kg/hr) 730 (0.5) 20 (0.1)   Total Output 730 20   Net +3289.9 +226.6        Stool Occurrence 2 x      PHYSICAL EXAMINATION: General:  Elderly , frail ,calm Neuro:  Gag cough wnl, awakens HEENT:  Dry mucosa  Cardiovascular:  ST , no m/r/g  Lungs:  Reduced , coarse Abdomen:  Soft , NT , BS normo Musculoskeletal:  Intact , moves all extremities  Skin:  Intact   LABS:  CBC  Recent Labs Lab 01/09/13 0510 01/10/13 0450 01/11/13 0515  WBC 8.3 7.2 9.3  HGB 10.5* 9.9* 10.3*  HCT 35.3* 33.8* 35.2*  PLT 244 211 249   Coag's  Recent Labs Lab 01/09/13 2218  INR 1.23   BMET  Recent Labs Lab 01/11/13 0945  01/11/13 1714 01/11/13 2110 01/05/2013 0500  NA 163*  < > 157* 157* 154*  K 4.3  --  4.2  --  3.9  CL 121*  --  117*  --  114*  CO2 34*  --  33*  --  32  BUN 23  --  23  --  22  CREATININE 0.71  --  0.76  --  0.86  GLUCOSE 135*  --  144*  --  166*  < > = values in this interval not displayed. Electrolytes  Recent Labs Lab 01/11/13 0515 01/11/13 0945 01/11/13 1714 12/28/2012 0500  CALCIUM 8.8 8.7 8.5 8.3*  MG 2.5  --   --  2.4  PHOS 3.6  --   --   --    Sepsis Markers No results found for this basename: LATICACIDVEN, PROCALCITON, O2SATVEN,  in the last 168 hours ABG  Recent Labs Lab 01/09/13 1538  PHART 7.493*  PCO2ART 44.3  PO2ART 71.0*   Liver Enzymes  Recent Labs Lab 01/10/13 0450 01/17/2013 0500  AST 35 70*  ALT 21 43*  ALKPHOS 72 78  BILITOT 0.7 0.6  ALBUMIN 2.4* 2.2*   Cardiac Enzymes No results found for this basename: TROPONINI, PROBNP,  in the last 168 hours Glucose  Recent Labs Lab 01/11/13 1147 01/11/13 1600 01/11/13 2033 01/11/13 2334 01/04/2013 0356 01/13/2013 0720  GLUCAP 177* 133* 140* 168* 156* 157*    Imaging Dg Chest Port 1 View  01/11/2013   CLINICAL DATA:  Pulmonary edema.  EXAM: PORTABLE CHEST - 1 VIEW  COMPARISON:  01/10/2013 and  01/09/2013  FINDINGS: Feeding tube and central venous catheter appear in good position, unchanged. There has been a slight decrease in the bilateral pulmonary edema. Marked prominence of the cardiac silhouette and pulmonary vascular congestion persists. And/or effusion at the left base is unchanged.  IMPRESSION: Slight improvement in pulmonary edema.   Electronically Signed   By: Geanie Cooley M.D.   On: 01/11/2013 07:56     CXR: none new  ASSESSMENT / PLAN:  PULMONARY A: Acute Resp Failure with hypoxia  Chronic int changes  P:   No lasix with Na  CARDIOVASCULAR A: Atrial Fib with RVR worsening Chronic Systolic and Diastolic CHF EF per echo 2013   P:  maintain amio drip SCD  Not candidate for anticoagulation Increase metoprolol  Maintain Cardizem oral  RENAL A:  Hypernatremia worsening, cerebellar bleed / cva P:   Chem in am Keep low dose free water kvo Consider dc 1/2 NS  GASTROINTESTINAL A:  Dysphagia , high risk for aspiration  Hx of repaired cleft palate  Malnutrition   P:   Continue on TF Strict asp precautions  PPI  May need peg if survives  HEMATOLOGIC A:  Anemia   P:  scd  INFECTIOUS A:  NOT impressed asp, no evidence infection P:   Follow fever curve  ENDOCRINE A:   Mild  Hyperglycemia on TF  Hypothryoid   P:   SSI sensitive  synthroid per tube   NEUROLOGIC A:  Right SCA CVA. That CVA, which had presentation was not hemorrhagic, has evolved into a hemorrhagic CVA per MRI  Let orbital Fx prior to admit - 12/21 NS consult - not surgical candidate   P:   See renal dnr Hob elevated  TODAY'S SUMMARY:  Continue not titration amio, cont cardizem, meto increase, to sdu, triad   Mcarthur Rossetti. [name], MD, FACP Pgr: [phone] [facility]   Update Worsening rr increase, discomfort Add prn morphine, call family consider comfort  Mcarthur Rossetti. [name], MD, FACP Pgr: [phone] [facility]

## 2013-01-20 NOTE — Progress Notes (Signed)
Pt temperature 102.1, per standing order, will give pt 650 mg tylenol suppository and continue to monitor.  Anna House

## 2013-01-20 NOTE — Progress Notes (Signed)
Extensive discussion with family. We discussed the poor prognosis and likely poor quality of life. Family has decided to offer full comfort care. They are aware that the patient may be transferred to palliative care floor for continued comfort care needs. They have been fully updated on the process and expectations. Daniel J. Feinstein, MD, FACP Pgr: [phone] [facility]   

## 2013-01-20 NOTE — Progress Notes (Signed)
Stroke Team Progress Note  HISTORY Anna House is a 80 y.o. female who experienced a witnessed fall while ambulating with her walker at home. The patient struck the front of her head with loss of consciousness. EMS was summoned and the patient was brought to the emergency department. Apparently she had an episode of vomiting while semi conscious. She woke up briefly while in the emergency room and was noted to be partially oriented with the ability to follow some commands. A CT scan showed no acute intracranial injury. She was noted to have a left ORBIT fracture as a result of the fall. Over the next several days she remained lethargic although she was oriented and was able to move all of her extremities. According to his chart there were no obvious neurologic deficits. She developed respiratory distress and was felt to have aspiration pneumonia. A feeding tube was placed. On 01/07/2013 she was noted to be more alert but still lethargic. On 01/08/2013 a decision was made to obtain a neurology consult and an MRI. The MRI revealed a hemorrhagic infarct involving the superior right cerebellum with a mass effect on the fourth ventricle but no hydrocephalus. Dr. Cyril Mourning was contacted as well as Dr. Newell Coral. Surgical intervention was not felt to be indicated. TPA was not indicated secondary to hemorrhage. The patient will be moved to the neuro intensive care unit.  SUBJECTIVE New amio yesterday for tachycardia. No family at bedside. RN expects them this am.  OBJECTIVE Most recent Vital Signs: Filed Vitals:   01/03/2013 0600 12/21/2012 0630 12/31/2012 0700 01/07/2013 0800  BP: 106/63 115/71 102/54 110/62  Pulse: 77 87 75 83  Temp:    97.4 F (36.3 C)  TempSrc:      Resp: 33 33 35 35  Height:      Weight:      SpO2: 95% 96% 97% 96%   CBG (last 3)   Recent Labs  01/11/13 2334 01/10/2013 0356 01/14/2013 0720  GLUCAP 168* 156* 157*   IV Fluid Intake:   . sodium chloride 40 mL/hr at 01/01/2013 0800  .  amiodarone (NEXTERONE PREMIX) 360 mg/200 mL dextrose 60 mg/hr (12/21/2012 0800)  . feeding supplement (JEVITY 1.2 CAL) 1,000 mL (12/23/2012 0800)  . sodium chloride 0.9 % 1,000 mL infusion Stopped (01/09/13 1900)   MEDICATIONS  . antiseptic oral rinse  15 mL Mouth Rinse q12n4p  . chlorhexidine  15 mL Mouth Rinse BID  . diltiazem  60 mg Oral Q6H  . docusate  100 mg Oral BID  . enoxaparin (LOVENOX) injection  40 mg Subcutaneous Q24H  . free water  200 mL Per Tube Q8H  . insulin aspart  1-3 Units Subcutaneous Q4H  . lactulose  20 g Oral BID  . levothyroxine  100 mcg Per Tube QAC breakfast  . metoprolol tartrate  25 mg Per Tube Q8H  . pantoprazole sodium  40 mg Per Tube Daily  . sodium chloride  3 mL Intravenous Q12H   PRN:  acetaminophen, metoprolol, ondansetron (ZOFRAN) IV  Diet:  NPO  Jevity tube feedings. Activity:  Up with assistance DVT Prophylaxis:  Lovenox 40 mg sq daily   CLINICALLY SIGNIFICANT STUDIES Basic Metabolic Panel:   Recent Labs Lab 01/11/13 0114 01/11/13 0515  01/11/13 1714 01/11/13 2110 01/07/2013 0500  NA 155* 163*  < > 157* 157* 154*  K 4.1 4.2  < > 4.2  --  3.9  CL 114* 122*  < > 117*  --  114*  CO2 32  33*  < > 33*  --  32  GLUCOSE 179* 139*  < > 144*  --  166*  BUN 22 23  < > 23  --  22  CREATININE 0.78 0.73  < > 0.76  --  0.86  CALCIUM 8.6 8.8  < > 8.5  --  8.3*  MG  --  2.5  --   --   --  2.4  PHOS  --  3.6  --   --   --   --   < > = values in this interval not displayed. Liver Function Tests:   Recent Labs Lab 01/10/13 0450 12/30/2012 0500  AST 35 70*  ALT 21 43*  ALKPHOS 72 78  BILITOT 0.7 0.6  PROT 5.8* 5.7*  ALBUMIN 2.4* 2.2*   CBC:   Recent Labs Lab 01/10/13 0450 01/11/13 0515  WBC 7.2 9.3  NEUTROABS  --  7.5  HGB 9.9* 10.3*  HCT 33.8* 35.2*  MCV 96.8 97.0  PLT 211 249   Coagulation:   Recent Labs Lab 01/09/13 2218  LABPROT 15.2  INR 1.23   Cardiac Enzymes:  No results found for this basename: CKTOTAL, CKMB, CKMBINDEX,  TROPONINI,  in the last 168 hours Urinalysis:  No results found for this basename: COLORURINE, APPERANCEUR, LABSPEC, PHURINE, GLUCOSEU, HGBUR, BILIRUBINUR, KETONESUR, PROTEINUR, UROBILINOGEN, NITRITE, LEUKOCYTESUR,  in the last 168 hours Lipid Panel     Component Value Date/Time   CHOL 170 01/09/2013 0510   TRIG 122 01/09/2013 0510   HDL 52 01/09/2013 0510   CHOLHDL 3.3 01/09/2013 0510   VLDL 24 01/09/2013 0510   LDLCALC 94 01/09/2013 0510   HgbA1C  No results found for this basename: HGBA1C   Urine Drug Screen:   No results found for this basename: labopia,  cocainscrnur,  labbenz,  amphetmu,  thcu,  labbarb    Alcohol Level: No results found for this basename: ETH,  in the last 168 hours  CT of the brain   01/10/2013  1. Similar size and distribution of right cerebellar infarct with similar mass effect on the adjacent 4th ventricle. No evidence of hydrocephalus or new intracranial hemorrhage identified. 2. Grossly stable left orbitl floor fracture. 3. Atrophy with chronic microvascular ischemic disease. 01/30/13   1. No evidence of acute intracranial injury.  2. Negative for acute cervical spine fracture. 3. Left orbital floor fracture with thin extraconal hemorrhage that does not cause proptosis. 4. Nondisplaced inferior left maxillary sinus fractures.  Mr Brain Wo Contrast  01/08/2013    1. Hemorrhagic infarct involving the superior right cerebellum with mass effect on the 4th ventricle but no hydrocephalus. 2. Multiple punctate areas susceptibility compatible with remote blood products suggest a diffuse vasculitis, specifically amyloid angiopathy. 3. Diffuse atrophy and white matter disease. This likely reflects the sequela of chronic microvascular ischemia. 4. Posttraumatic changes of the left maxilla are again noted.    EEG - This is an abnormal EEG. The posterior background slowing noted during wakefulness may be an indicator of how difficult it is to arouse the patient fully  or may represent a diffuse gray matter disturbance that is etiologically nonspecific but may include a metabolic encephalopathy among other possibilities. Although drowsiness exhibits abnormal characteristics no epileptiform activity is noted.   2D Echocardiogram  Systolic function was moderately to severely reduced. The estimated ejection fraction was in the range of 30% to 35%. Diffuse hypokinesis. There is severe hypokinesis of the entireanteroseptal myocardium. There is severe hypokinesis of the  entireinferoseptal myocardium. Features are consistent with a pseudonormal left ventricular filling pattern, with concomitant abnormal relaxation and increased filling pressure (grade 2 diastolic dysfunction).  Carotid Doppler  Findings suggest 1-39% internal carotid artery stenosis bilaterally. Vertebral arteries are patent with antegrade flow.  CXR  01/11/2013 Slight improvement in pulmonary edema.  EKG Afib VR 82 BPM  TCD poor right temporal window. Low velocities right and posterior likely due to technical difficulties from poor windows.  Therapy Recommendations skilled nursing facility   Physical Exam    Afebrile. Head is traumatic with left periorbital ecchymoses. Neck is supple without bruit.   . Cardiac exam no murmur or gallop. Lungs are clear to auscultation. Distal pulses are well felt. Neurologic Examination  Mental Status:  The patient is lying in bed with eyes closed. With deep sternal rub the patient groans and localizes to pain with both upper extremities. She follows no commands.  Cranial Nerves:  II: Pupils 3mm, round and briskly reactive bilaterally. There is no blink to threat bilaterally. The left orbit is ecchymotic.  III,IV, VI: No response to oculocephalic maneuvers  V,VII: Corneals intact bilaterally  VIII: Could not be tested  IX,X: gag reflex is present XI: Shoulder shrug could not be tested  XII: The patient does not protrude her tongue  Motor:  Patient lifts both  upper extremities off bed in response to pain. Movement noted in the right lower extremity as well. Only minimal LLE movement noted.  Sensory: Patient responds to noxious stimuli in all extremities.  Deep Tendon Reflexes: 2+ in the upper extremities, 1+ at the knees and absent at ankles  Plantars:  Right: Upgoing Left: Upgoing  Cerebellar:  Could not be tested  Gait: Could not be tested   ASSESSMENT Ms. Anna House is a 80 y.o. female presenting with loss of consciousness after falling while ambulating with her walker.  An MRI revealed a hemorrhagic infarct involving the superior right cerebellum with mass effect/cytotoxic cerebral edema on the 4th ventricle but no hydrocephalus. Infarct felt to be secondary to atrial fibrillation with trauma after the patient fell at home while ambulating with her walker. On no antithrombotics prior to admission. Now on no antithrombotics for secondary stroke prevention given hemorrhage. Patient with resultant decreased level of responsiveness, encephalopathic state (multifactorial due to fever, infection, stroke, hemorrhage, hypernatremia). Work up underway. Globally, condition appears to be deteriorating. Neuro prognosis for recovery is ppor   Induced hypernatremia with 3% to decrease cerebral edema, now off, on 1/2 NS, Na dropping slowly  Febrile. Temp 102.1 past 24h  Atrial fibrillation with rapid ventricular response. New amio started 12/23 for rate control  Fractured left orbit secondary to falling prior to admission  Acute encephalopathy  NPO - Jevity tube feedings.  Hypertonic saline to be discontinued.  Hospital day # 9  TREATMENT/PLAN  Continue no antithrombotics for secondary stroke prevention secondary to hemorrhage.  Increase SBP goal to <180  Monitor Na daily to insure it is decreasing  Therapists recommend skilled nursing facility placement  Dr. Pearlean Brownie would like to discuss plan of care with patient's family. RN will call  when they arrive.   Annie Main, MSN, RN, ANVP-BC, ANP-BC, [facility] Pager: [phone] 01-26-2013 9:02 AM  I have personally obtained a history, examined the patient, evaluated imaging results, and formulated the assessment and plan of care. I agree with the above.  This patient is critically ill and at significant risk of neurological worsening, death and care requires constant monitoring  of vital signs, hemodynamics,respiratory and cardiac monitoring,review of multiple databases, neurological assessment, discussion with family, other specialists and medical decision making of high complexity. I spent 32 minutes of neurocritical care time  in the care of  this patient. Delia Heady, MD

## 2013-01-20 NOTE — Progress Notes (Signed)
PT Cancellation Note  Patient Details Name: Anna House MRN: [id number] DOB: 01-30-1933   Cancelled Treatment:    Reason Eval/Treat Not Completed: Medical issues which prohibited therapy. RN deferred therapy services at this time. Pt with medical decline and family transitioning to comfort care. PT signing of at this time. Please re-consult if needed in future.   Marcene Brawn 12/20/2012, 11:53 AM

## 2013-01-20 NOTE — Progress Notes (Signed)
Pt's heart rate increased and sustained in 150's, respirations mid 30's, pt moaning with slight elevation in BP. Notified Elink MD, Dr. Sung Amabile.   Medication orders adjusted, will continue to monitor.  Vannesa Abair GARNER

## 2013-01-20 NOTE — Progress Notes (Signed)
Chaplain responded to consult to meet with family of dying pt.  Chaplain spent time talking with the family and providing emotional support.  Family seemed to have a healthy amount of support among each other.    Chaplain assessment: with such a strong amount of support among family members, it seems that this experience will be challenging for them but manageable.

## 2013-01-20 DEATH — deceased

## 2013-02-20 DEATH — deceased

## 2014-11-24 IMAGING — CR DG ABD PORTABLE 1V
1 series · 1 of 1 positions shown · non-contrast
Comparison: None.

CLINICAL DATA: Feeding tube placement.

EXAM:
PORTABLE ABDOMEN - 1 VIEW

[AP]
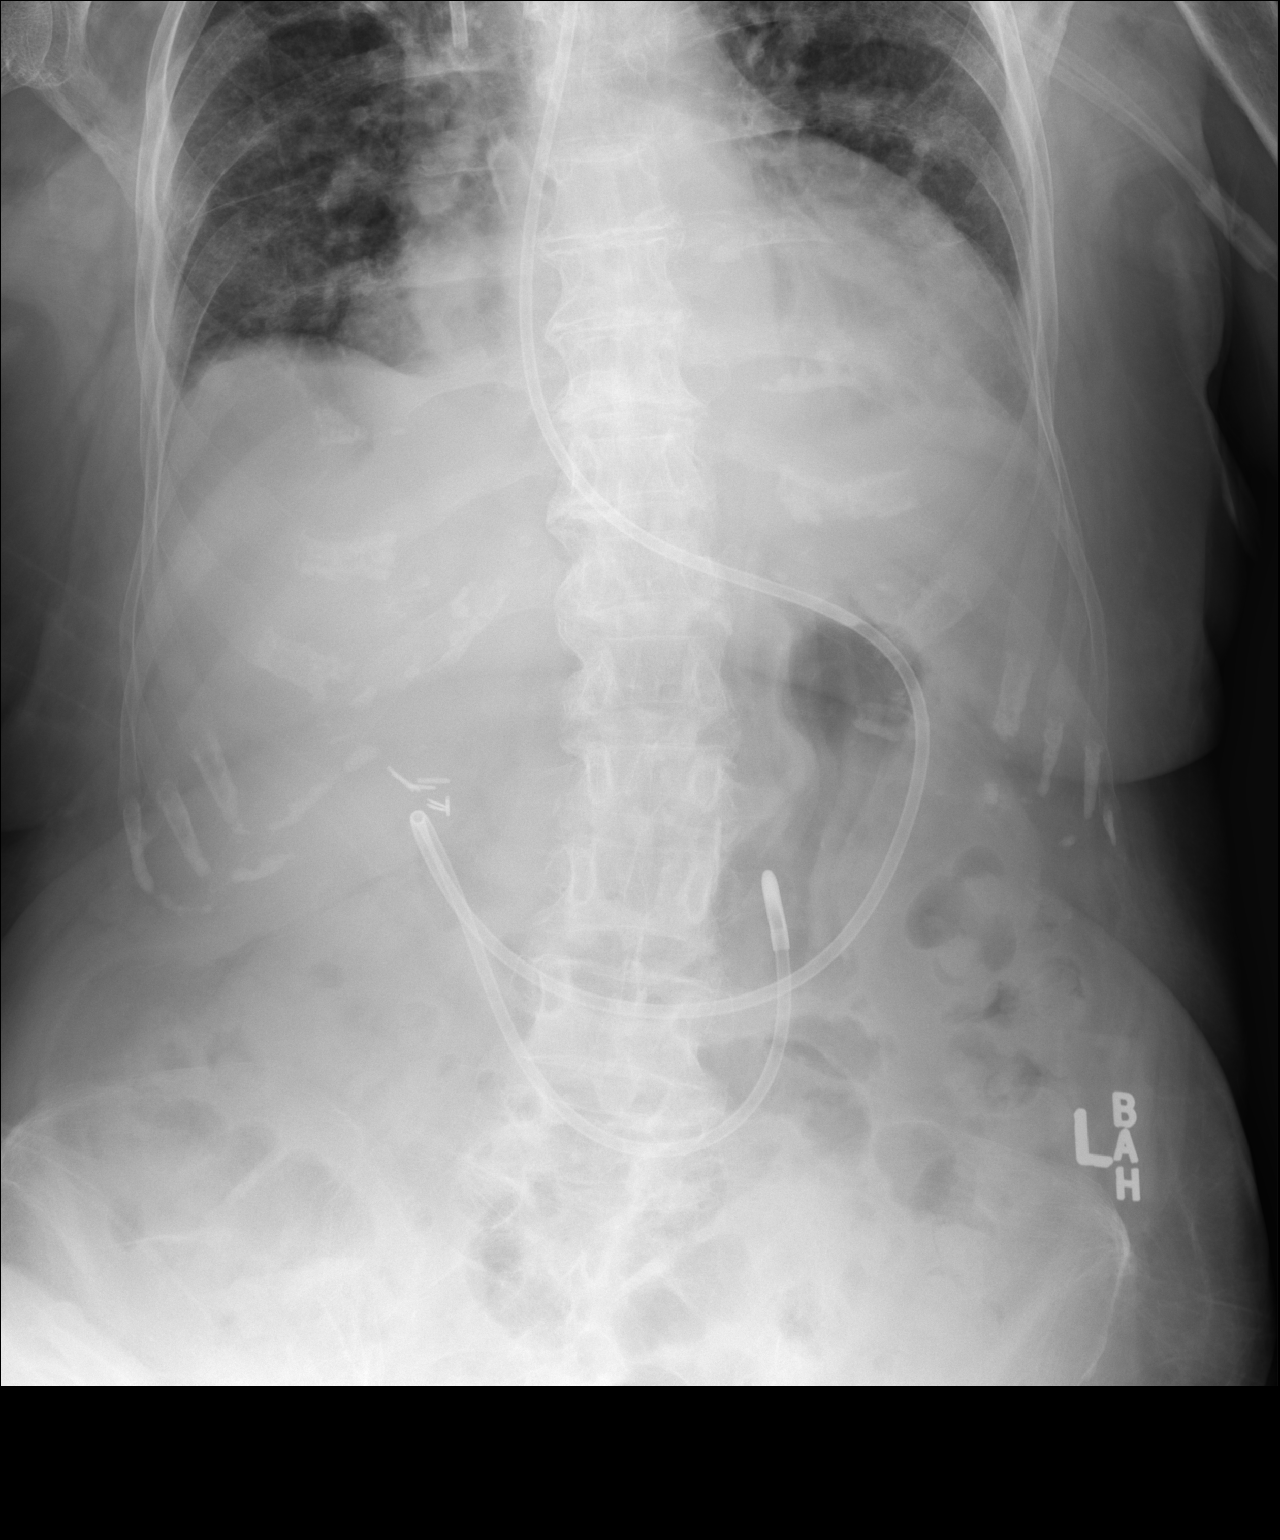

[1 of 1 positions shown; findings below may reference images not displayed]

FINDINGS: Weighted tip feeding tube is present with the tip at the ligament of
Treitz. Cholecystectomy clips are present in the right upper
quadrant. Bowel gas pattern appears within normal limits.
IMPRESSION: Post pyloric feeding tube position with the tip at the ligament of
Treitz.

## 2014-11-28 IMAGING — CT CT HEAD W/O CM
1 series · 15 of 30 positions shown, 19 images · non-contrast
Comparison: Prior MRI from 01/08/2013

CLINICAL DATA: Followup intracranial hemorrhage.

EXAM:
CT HEAD WITHOUT CONTRAST
TECHNIQUE: Contiguous axial images were obtained from the base of the skull
through the vertex without intravenous contrast.

[Series 2: head 5.0 h30s · axial · 0.41mm/px · z∈[-51,+84]mm · 15 of 30 slices shown, 19 images]
[im 2/30  brain]
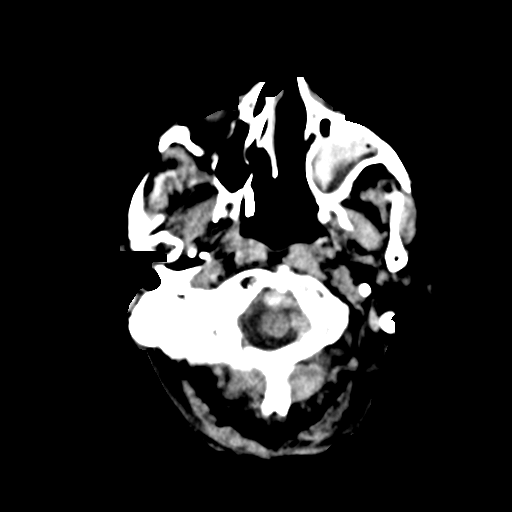
[im 2/30  bone]
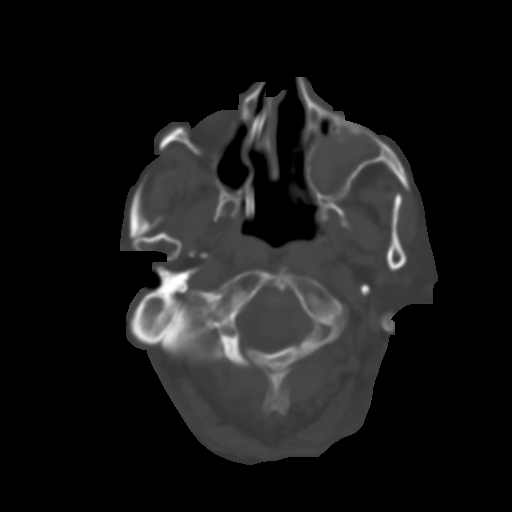
[im 4/30  brain]
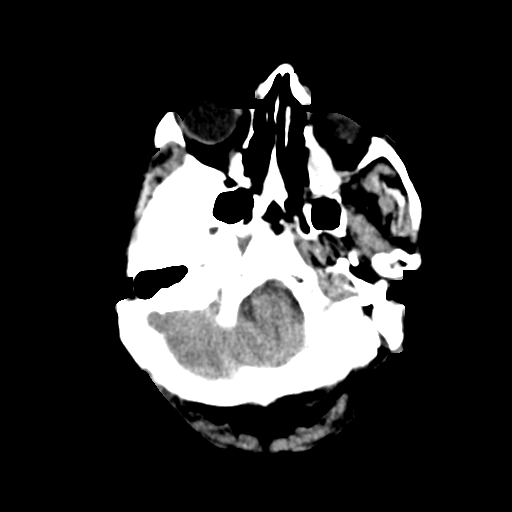
[im 6/30  brain]
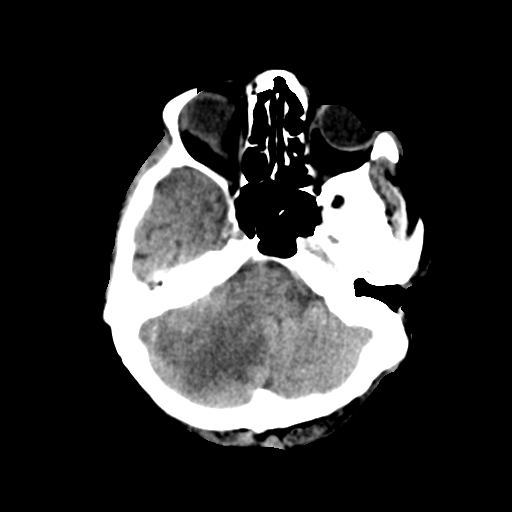
[im 8/30  brain]
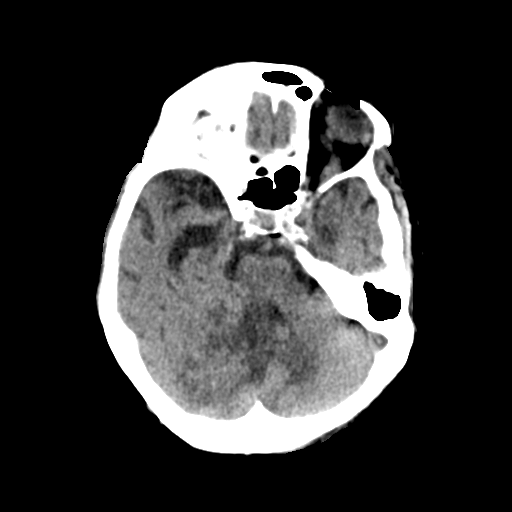
[im 10/30  brain]
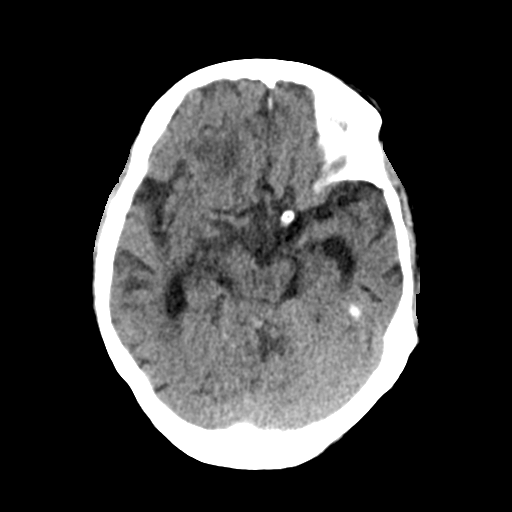
[im 10/30  bone]
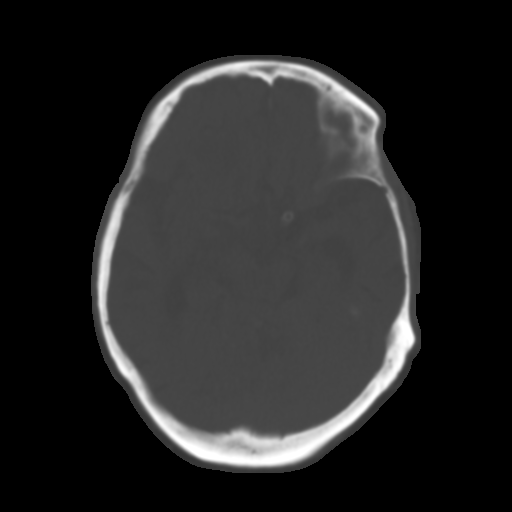
[im 12/30  brain]
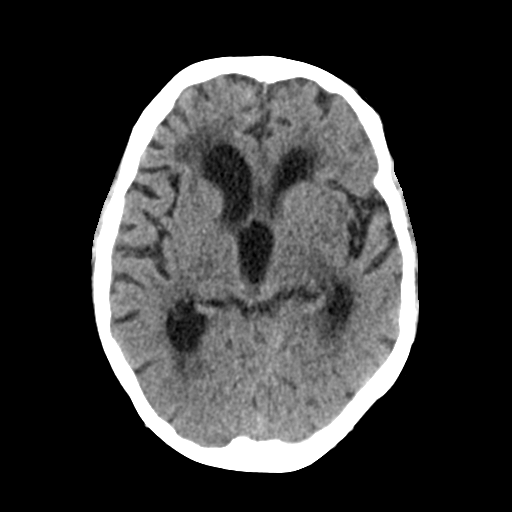
[im 14/30  brain]
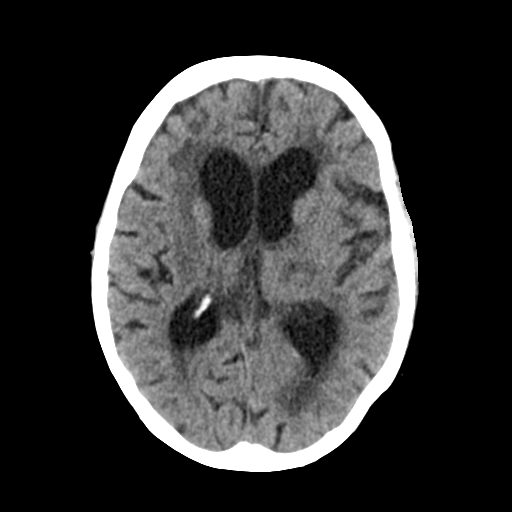
[im 16/30  brain]
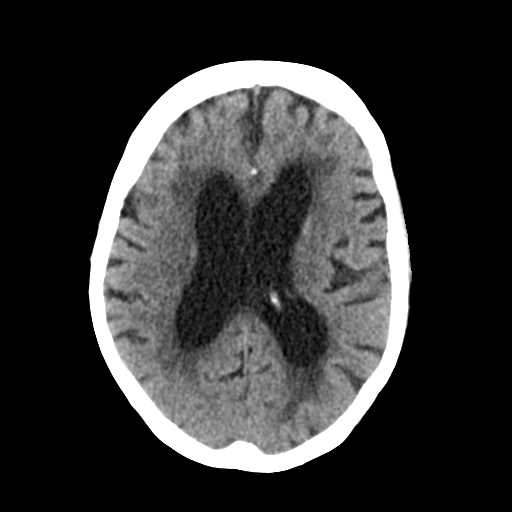
[im 17/30  brain]
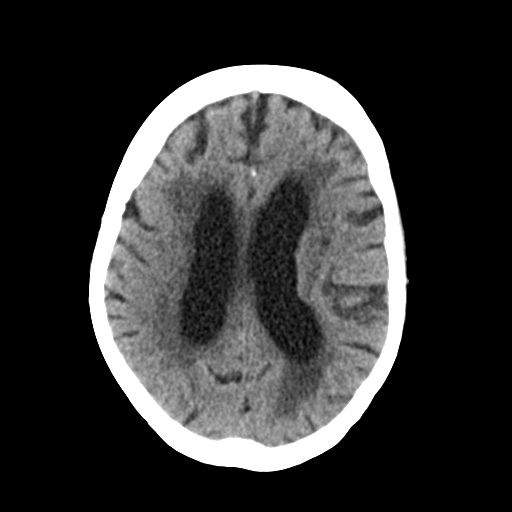
[im 17/30  bone]
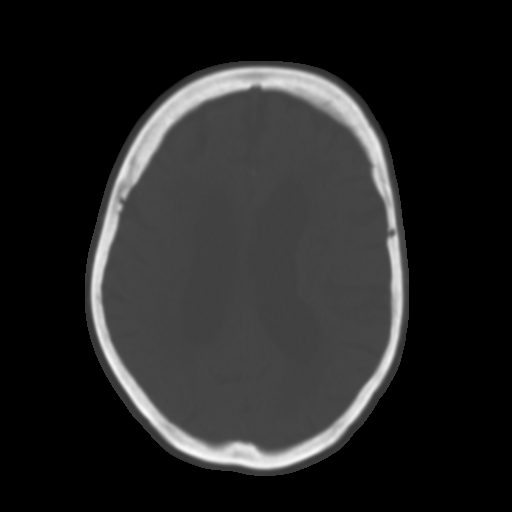
[im 19/30  brain]
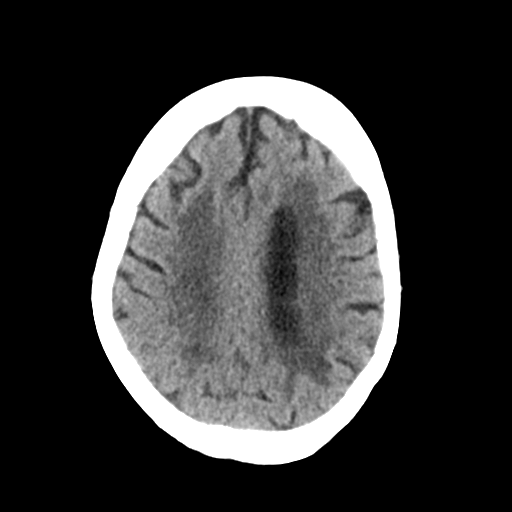
[im 21/30  brain]
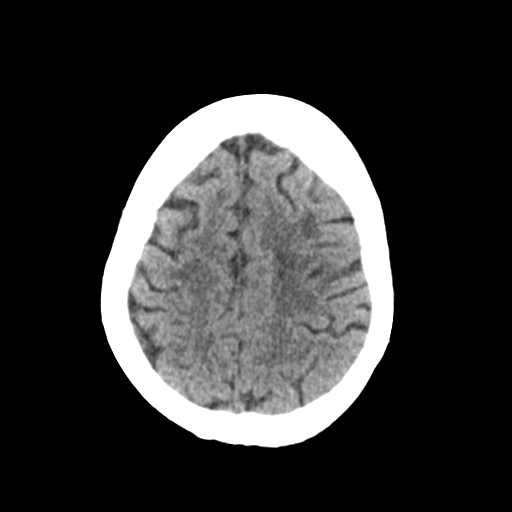
[im 23/30  brain]
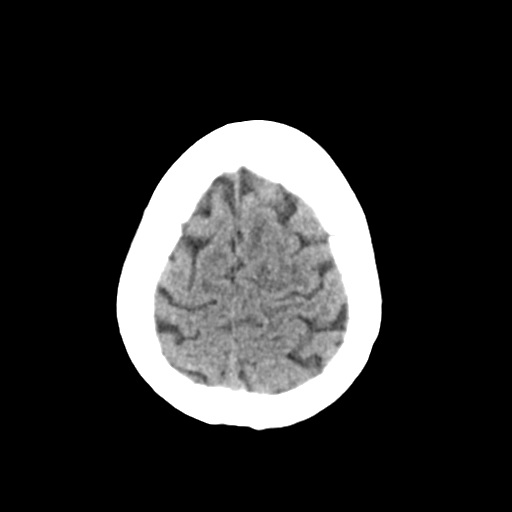
[im 25/30  brain]
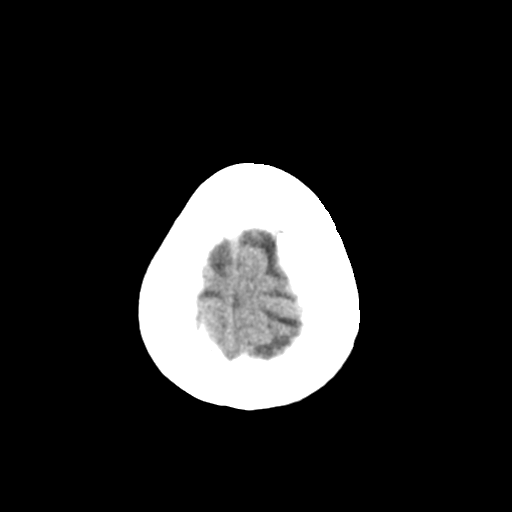
[im 25/30  bone]
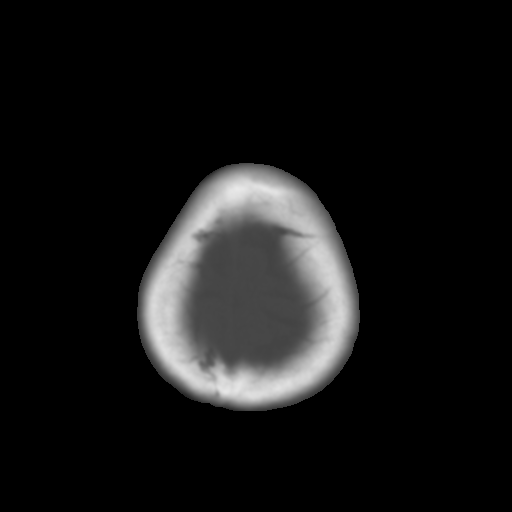
[im 27/30  brain]
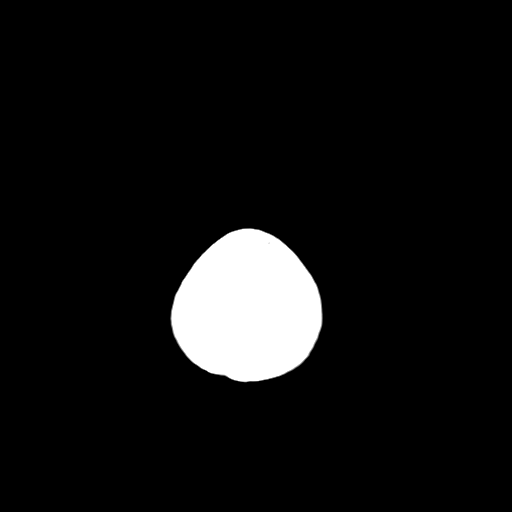
[im 29/30  brain]
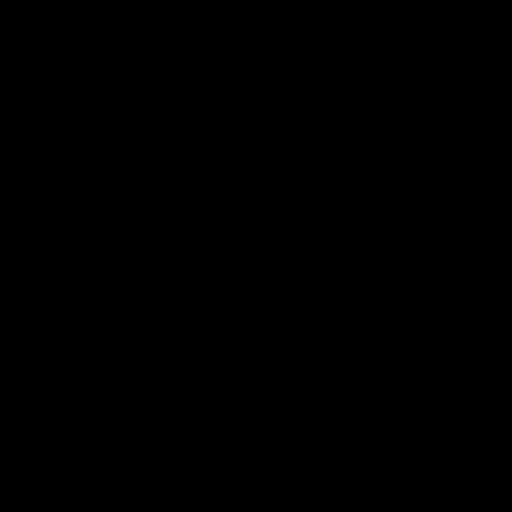

[15 of 30 positions shown; findings below may reference images not displayed]

FINDINGS: Previously identified infarct involving the right cerebellum is
similar in size measuring approximately and 3.9 x 4.7 cm. A few
serpiginous areas of hyperdensity within the infarcted area likely
represent small amount of blood. Overall, finding is unchanged
relative to previous MRI. Again, the infarct extends into the vermis
There is similar mass effect on the adjacent 4th ventricle. Overall
ventricle size is stable as compared to the previous examination
without evidence of hydrocephalus.

No new hemorrhage identified. No mass or midline shift. Atrophy with
chronic microvascular ischemic changes again noted. No extra-axial
fluid collection.

Calvarium is intact. Left orbital floor fracture again noted.
Previous identified extra conal intraorbital hemorrhage is grossly
stable.

An enteric tube is in place.

Sequelae of prior bilateral mastoidectomies are noted.
IMPRESSION: 1. Similar size and distribution of right cerebellar infarct with
similar mass effect on the adjacent 4th ventricle. No evidence of
hydrocephalus or new intracranial hemorrhage identified.
2. Grossly stable left orbital floor fracture.
3. Atrophy with chronic microvascular ischemic disease.

## 2014-11-29 IMAGING — CR DG CHEST 1V PORT
1 series · 1 of 1 positions shown · non-contrast
Comparison: 01/10/2013 and 01/09/2013

CLINICAL DATA: Pulmonary edema.

EXAM:
PORTABLE CHEST - 1 VIEW

[AP]
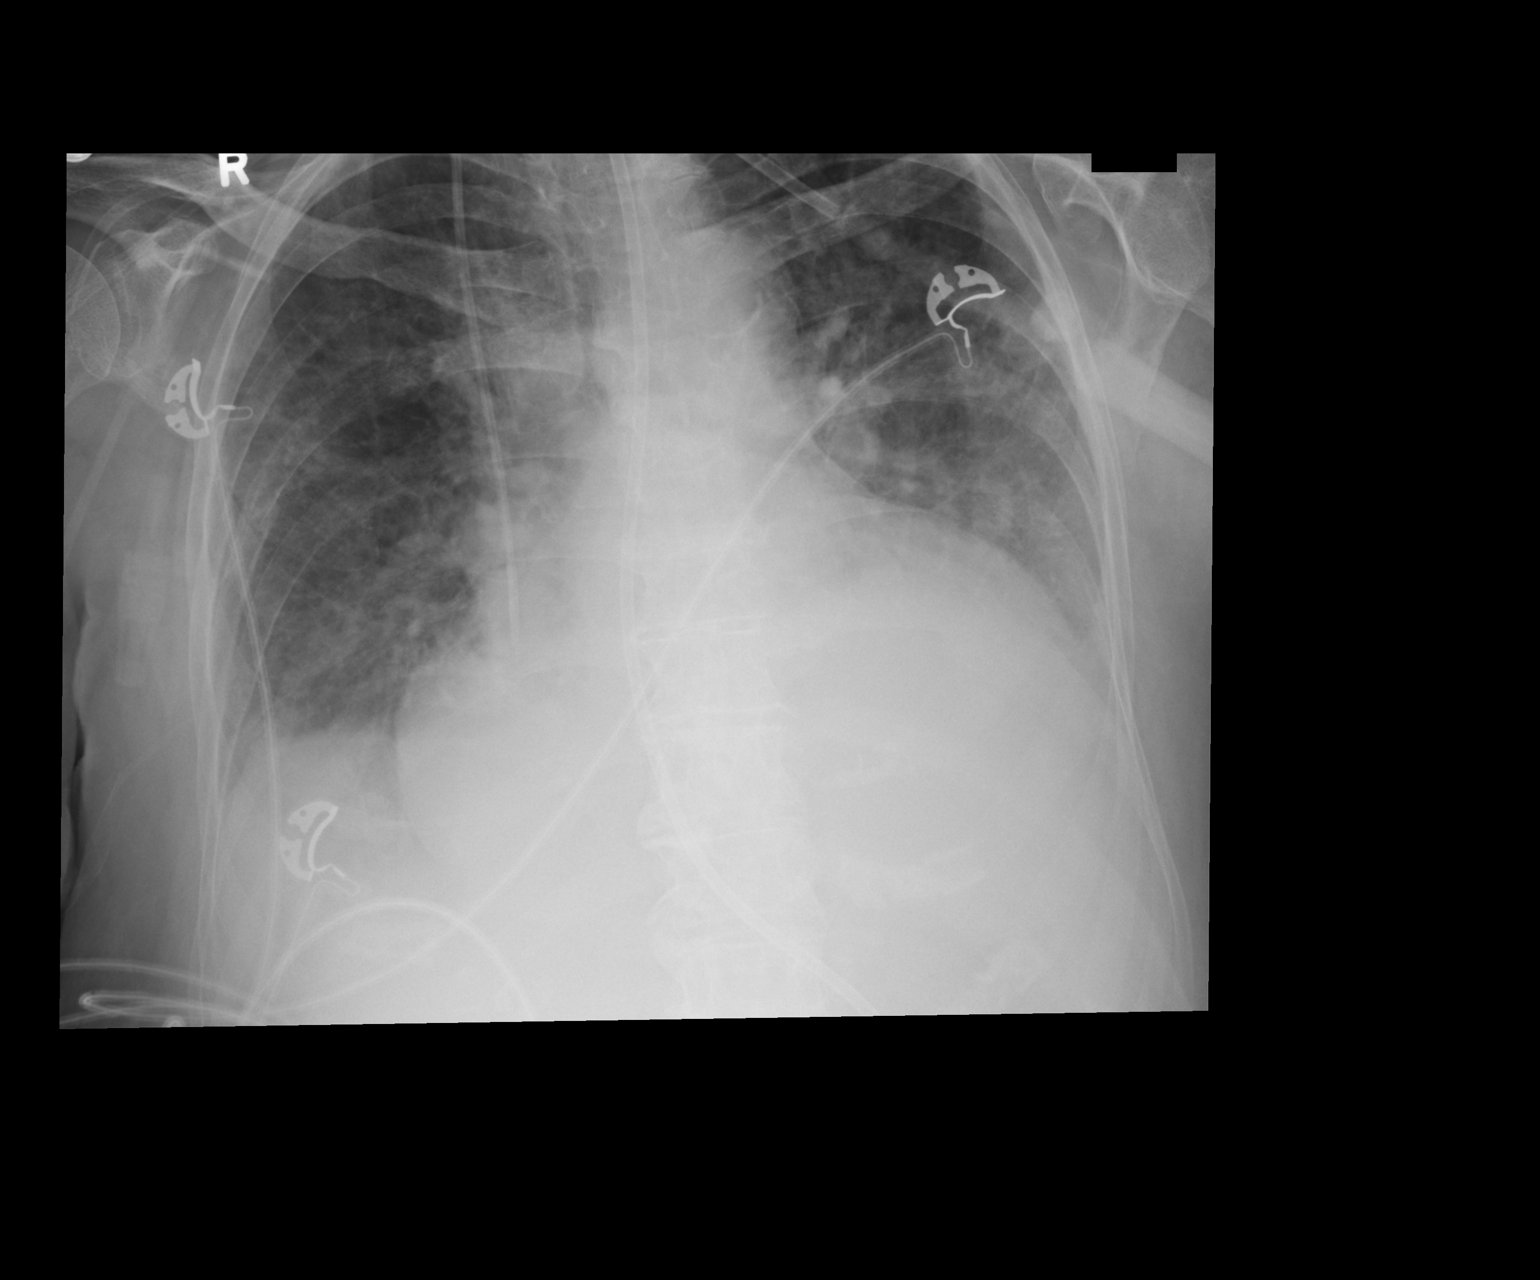

[1 of 1 positions shown; findings below may reference images not displayed]

FINDINGS: Feeding tube and central venous catheter appear in good position,
unchanged. There has been a slight decrease in the bilateral
pulmonary edema. Marked prominence of the cardiac silhouette and
pulmonary vascular congestion persists. And/or effusion at the left
base is unchanged.
IMPRESSION: Slight improvement in pulmonary edema.
